# Patient Record
Sex: Female | Born: 1970 | Race: White | Hispanic: No | Marital: Married | Smoking: Never smoker
Health system: Southern US, Community
[De-identification: ages and names within clinical notes are randomized; demographics above are authoritative.]

## PROBLEM LIST (undated history)

## (undated) DIAGNOSIS — G35 Multiple sclerosis: Secondary | ICD-10-CM

## (undated) DIAGNOSIS — H469 Unspecified optic neuritis: Secondary | ICD-10-CM

## (undated) DIAGNOSIS — M21379 Foot drop, unspecified foot: Secondary | ICD-10-CM

## (undated) HISTORY — PX: WRIST SURGERY: SHX841

## (undated) HISTORY — DX: Multiple sclerosis: G35

## (undated) HISTORY — PX: SHOULDER SURGERY: SHX246

## (undated) HISTORY — DX: Foot drop, unspecified foot: M21.379

## (undated) HISTORY — DX: Unspecified optic neuritis: H46.9

## (undated) HISTORY — PX: ABDOMINAL HYSTERECTOMY: SUR658

---

## 2008-01-08 ENCOUNTER — Ambulatory Visit: Payer: Self-pay | Admitting: Family Medicine

## 2008-01-08 DIAGNOSIS — E282 Polycystic ovarian syndrome: Secondary | ICD-10-CM | POA: Insufficient documentation

## 2008-01-08 DIAGNOSIS — R635 Abnormal weight gain: Secondary | ICD-10-CM | POA: Insufficient documentation

## 2008-01-08 DIAGNOSIS — G35 Multiple sclerosis: Secondary | ICD-10-CM | POA: Insufficient documentation

## 2008-01-08 DIAGNOSIS — F3289 Other specified depressive episodes: Secondary | ICD-10-CM | POA: Insufficient documentation

## 2008-01-08 DIAGNOSIS — N760 Acute vaginitis: Secondary | ICD-10-CM | POA: Insufficient documentation

## 2008-01-08 DIAGNOSIS — F329 Major depressive disorder, single episode, unspecified: Secondary | ICD-10-CM | POA: Insufficient documentation

## 2008-01-08 LAB — CONVERTED CEMR LAB
ALT: 14 units/L (ref 0–35)
AST: 12 units/L (ref 0–37)
Albumin: 3.8 g/dL (ref 3.5–5.2)
Calcium: 8.4 mg/dL (ref 8.4–10.5)
Chloride: 105 meq/L (ref 96–112)
Hgb A1c MFr Bld: 5.3 % (ref 4.6–6.1)
LDL Cholesterol: 106 mg/dL — ABNORMAL HIGH (ref 0–99)
Potassium: 4.3 meq/L (ref 3.5–5.3)
TSH: 0.643 microintl units/mL (ref 0.350–5.50)
Total CHOL/HDL Ratio: 4

## 2008-01-09 ENCOUNTER — Encounter: Payer: Self-pay | Admitting: Family Medicine

## 2008-01-09 LAB — CONVERTED CEMR LAB: Clue Cells Wet Prep HPF POC: NONE SEEN

## 2008-01-23 ENCOUNTER — Ambulatory Visit (HOSPITAL_COMMUNITY): Payer: Self-pay | Admitting: Psychiatry

## 2008-01-23 ENCOUNTER — Encounter: Payer: Self-pay | Admitting: Family Medicine

## 2008-02-06 ENCOUNTER — Ambulatory Visit (HOSPITAL_COMMUNITY): Payer: Self-pay | Admitting: Psychiatry

## 2008-02-10 ENCOUNTER — Other Ambulatory Visit: Admission: RE | Admit: 2008-02-10 | Discharge: 2008-02-10 | Payer: Self-pay | Admitting: Family Medicine

## 2008-02-10 ENCOUNTER — Ambulatory Visit: Payer: Self-pay | Admitting: Family Medicine

## 2008-02-10 ENCOUNTER — Encounter: Payer: Self-pay | Admitting: Family Medicine

## 2008-03-10 ENCOUNTER — Telehealth (INDEPENDENT_AMBULATORY_CARE_PROVIDER_SITE_OTHER): Payer: Self-pay | Admitting: *Deleted

## 2008-03-11 ENCOUNTER — Telehealth: Payer: Self-pay | Admitting: Family Medicine

## 2008-03-11 DIAGNOSIS — R5381 Other malaise: Secondary | ICD-10-CM | POA: Insufficient documentation

## 2008-03-11 DIAGNOSIS — R5383 Other fatigue: Secondary | ICD-10-CM

## 2008-03-12 ENCOUNTER — Encounter: Payer: Self-pay | Admitting: Family Medicine

## 2008-03-13 LAB — CONVERTED CEMR LAB
Basophils Relative: 0 % (ref 0–1)
Ferritin: 40 ng/mL (ref 10–291)
Folate: >20 ng/mL
Hemoglobin: 13.2 g/dL (ref 12.0–15.0)
Lymphocytes Relative: 19 % (ref 12–46)
Lymphs Abs: 2.1 10*3/uL (ref 0.7–4.0)
MCHC: 31.5 g/dL (ref 30.0–36.0)
Monocytes Absolute: 0.8 10*3/uL (ref 0.1–1.0)
Monocytes Relative: 7 % (ref 3–12)
Neutro Abs: 8.2 10*3/uL — ABNORMAL HIGH (ref 1.7–7.7)
RBC: 4.57 M/uL (ref 3.87–5.11)
TIBC: 338 ug/dL (ref 250–470)
WBC: 11.4 10*3/uL — ABNORMAL HIGH (ref 4.0–10.5)

## 2008-03-16 ENCOUNTER — Telehealth: Payer: Self-pay | Admitting: Family Medicine

## 2008-05-25 IMAGING — CR DG ABDOMEN 1V
2 series · 2 of 2 positions shown · non-contrast
Comparison: None

CLINICAL DATA: Left flank pain, hematuria, nausea and vomiting

ABDOMEN - 1 VIEW

[view not recorded (1 of 2)]
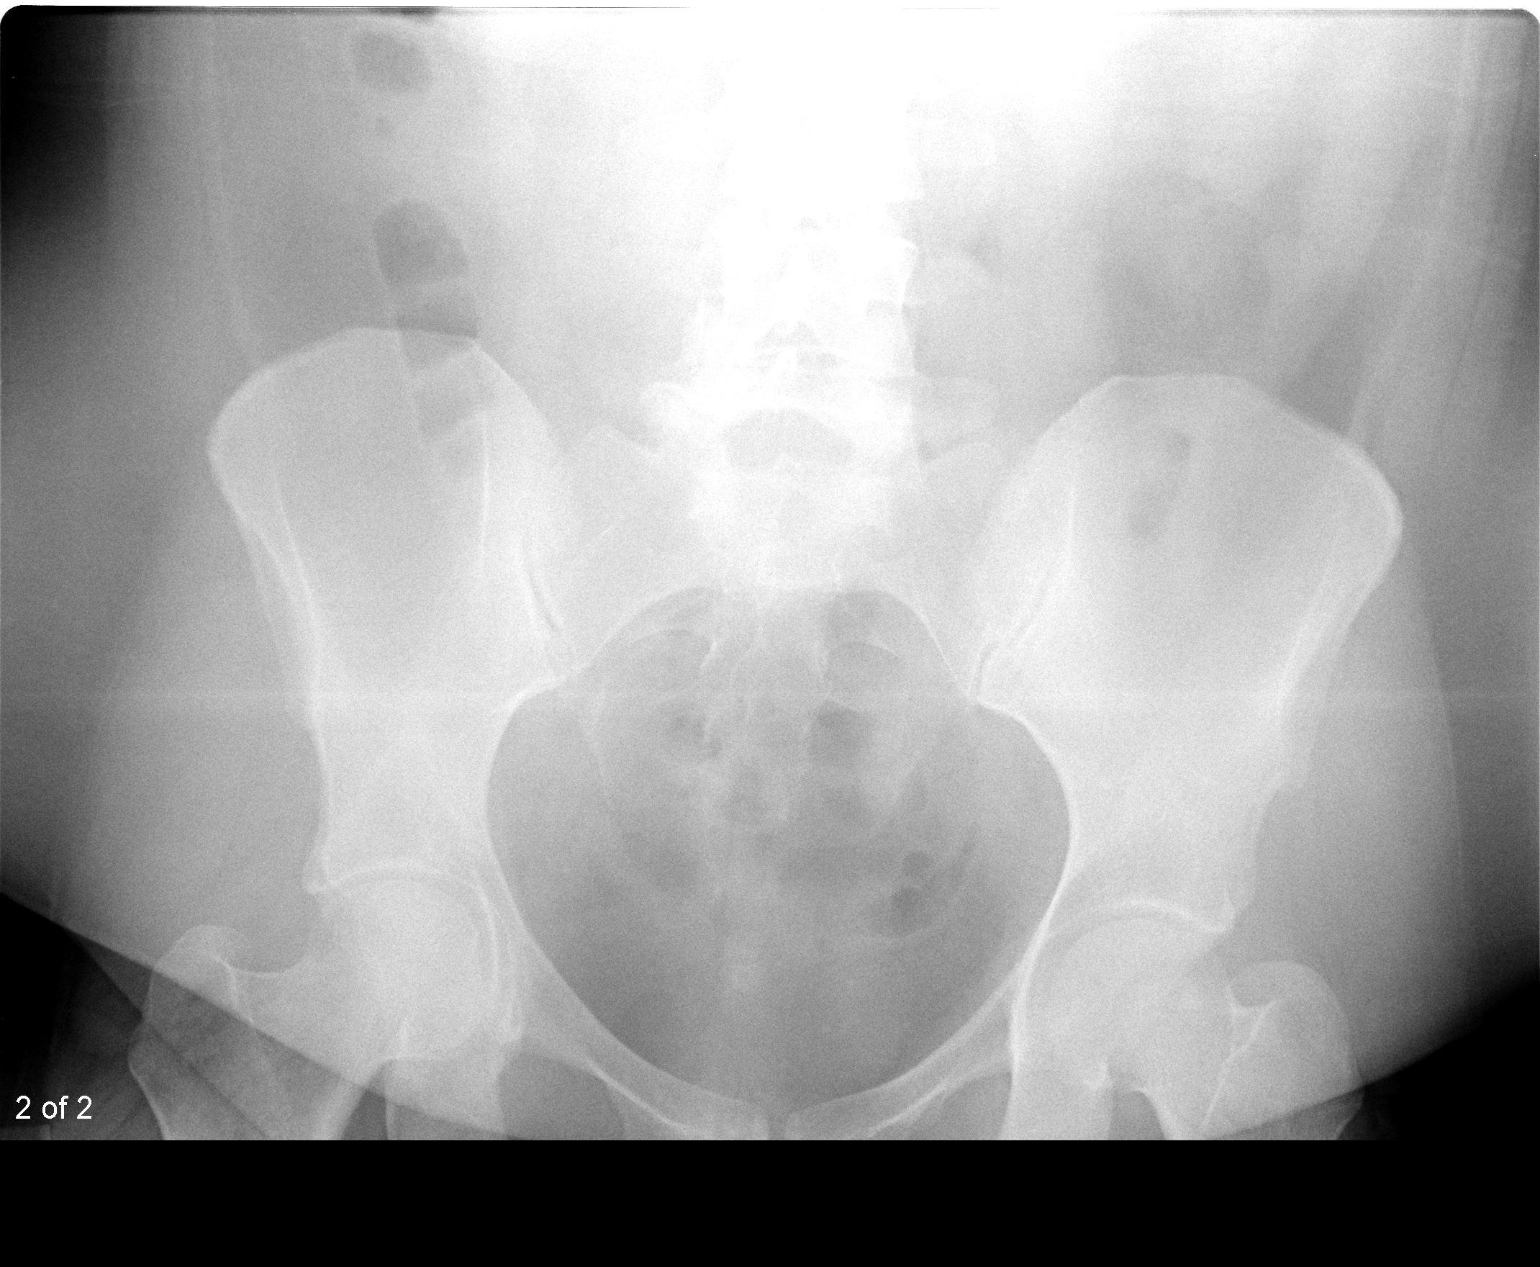

[view not recorded (2 of 2)]
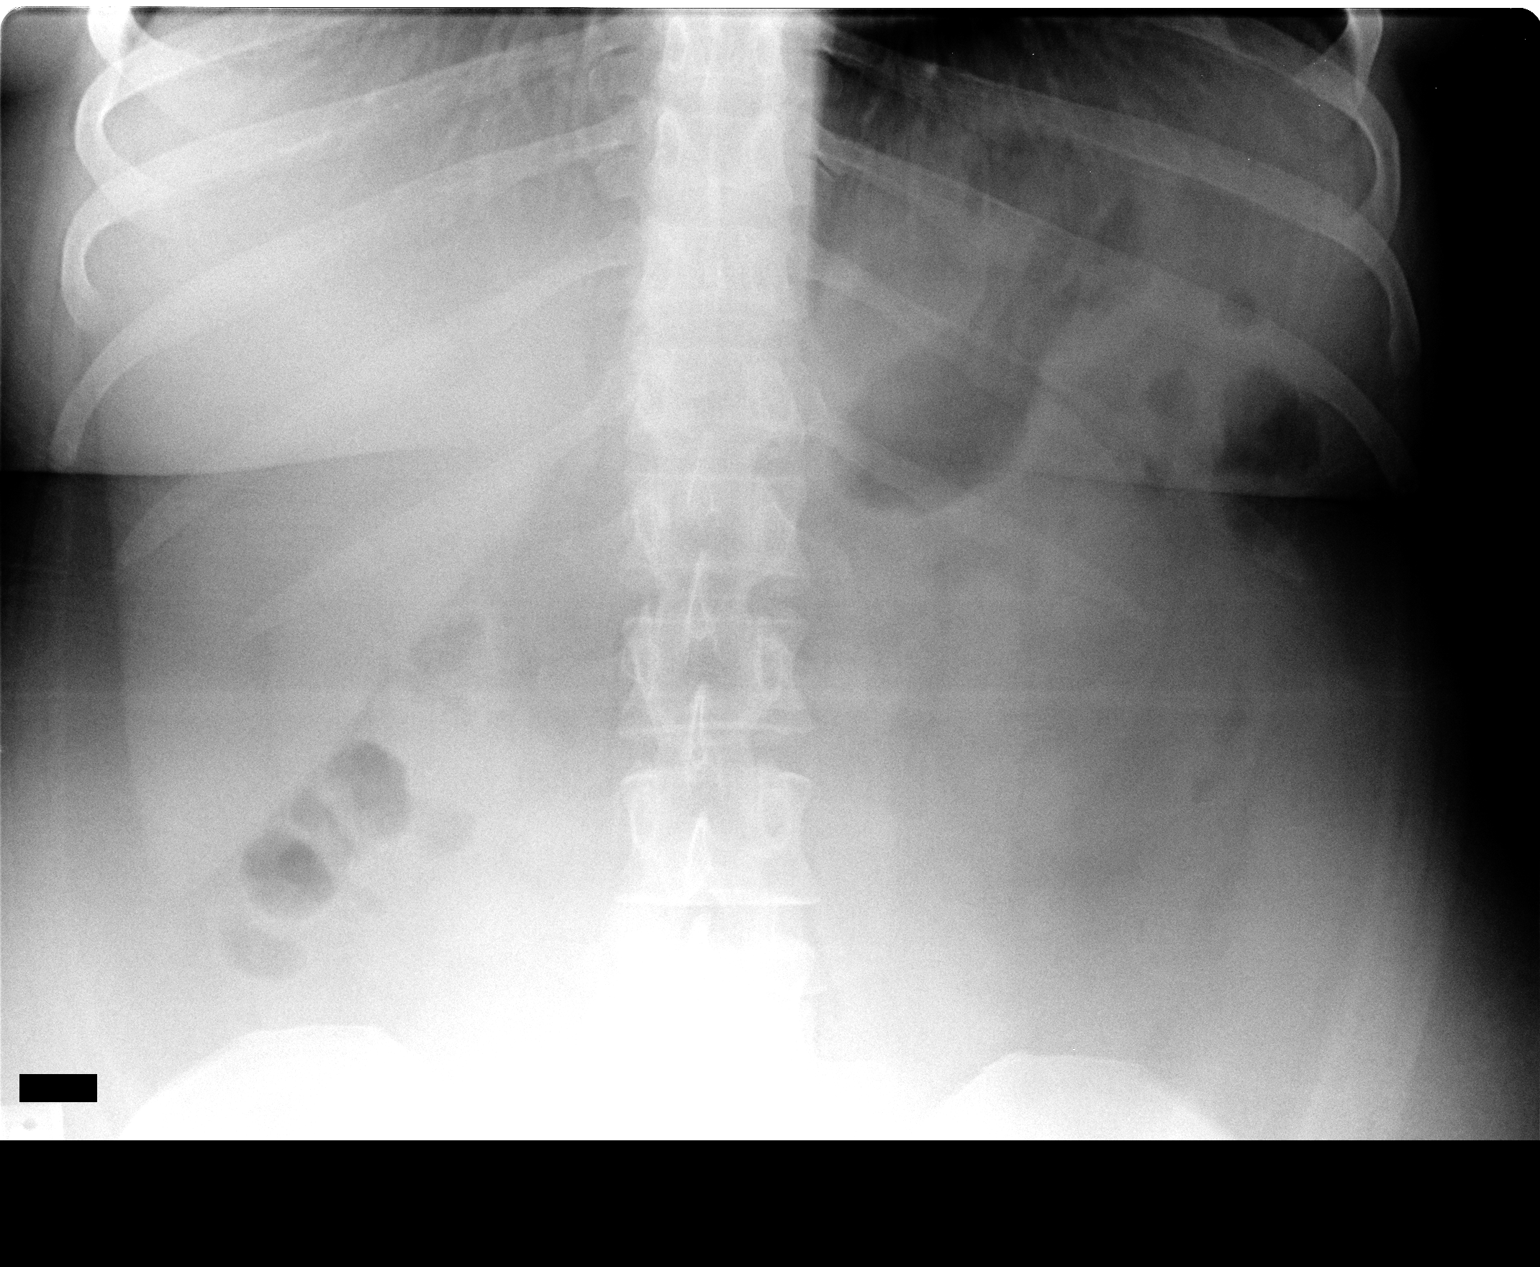

[2 of 2 positions shown; findings below may reference images not displayed]

FINDINGS: Supine film of the abdomen shows a nonspecific bowel gas
pattern.  No opaque calculi are seen overlying the kidneys or the
expected courses of the ureters.  There are degenerative changes in
the lower lumbar spine.
IMPRESSION: 1.  Nonspecific bowel gas pattern.
2.  No opaque calculi are noted.

## 2008-05-28 ENCOUNTER — Ambulatory Visit: Payer: Self-pay | Admitting: Family Medicine

## 2008-05-28 ENCOUNTER — Encounter: Admission: RE | Admit: 2008-05-28 | Discharge: 2008-05-28 | Payer: Self-pay | Admitting: Family Medicine

## 2008-05-28 DIAGNOSIS — N3000 Acute cystitis without hematuria: Secondary | ICD-10-CM | POA: Insufficient documentation

## 2008-05-28 DIAGNOSIS — N200 Calculus of kidney: Secondary | ICD-10-CM | POA: Insufficient documentation

## 2008-05-28 LAB — CONVERTED CEMR LAB
Bilirubin Urine: NEGATIVE
Glucose, Urine, Semiquant: NEGATIVE
Ketones, urine, test strip: NEGATIVE
Specific Gravity, Urine: 1.025
pH: 5.5

## 2008-05-29 ENCOUNTER — Encounter: Payer: Self-pay | Admitting: Family Medicine

## 2008-06-29 ENCOUNTER — Encounter: Payer: Self-pay | Admitting: Family Medicine

## 2008-06-29 LAB — CONVERTED CEMR LAB
ALT: 19 units/L
AST: 15 units/L
Albumin: 3.2 g/dL
Alkaline Phosphatase: 84 units/L
Calcium: 8.9 mg/dL
Chloride: 105 meq/L
Potassium: 4.5 meq/L
Sodium: 139 meq/L

## 2008-07-01 ENCOUNTER — Encounter: Payer: Self-pay | Admitting: Family Medicine

## 2009-01-18 ENCOUNTER — Encounter: Payer: Self-pay | Admitting: Family Medicine

## 2009-01-26 ENCOUNTER — Encounter: Payer: Self-pay | Admitting: Family Medicine

## 2009-03-08 ENCOUNTER — Encounter: Payer: Self-pay | Admitting: Family Medicine

## 2009-04-05 ENCOUNTER — Encounter: Admission: RE | Admit: 2009-04-05 | Discharge: 2009-04-05 | Payer: Self-pay | Admitting: Family Medicine

## 2009-04-05 ENCOUNTER — Ambulatory Visit: Payer: Self-pay | Admitting: Family Medicine

## 2009-04-05 ENCOUNTER — Encounter: Payer: Self-pay | Admitting: Family Medicine

## 2009-04-05 ENCOUNTER — Other Ambulatory Visit: Admission: RE | Admit: 2009-04-05 | Discharge: 2009-04-05 | Payer: Self-pay | Admitting: Family Medicine

## 2009-04-05 IMAGING — MG MM DIGITAL SCREENING BILAT W/ CAD
6 series · 6 of 6 positions shown · non-contrast
Comparison: none

DG SCREEN MAMMOGRAM BILATERAL
Bilateral CC and MLO view(s) were taken.
Technologist: NORHIDAYU

DIGITAL SCREENING MAMMOGRAM WITH CAD:
The breast tissue is almost entirely fatty.  No masses or malignant type calcifications are 
identified.

[R CC (1 of 2)]
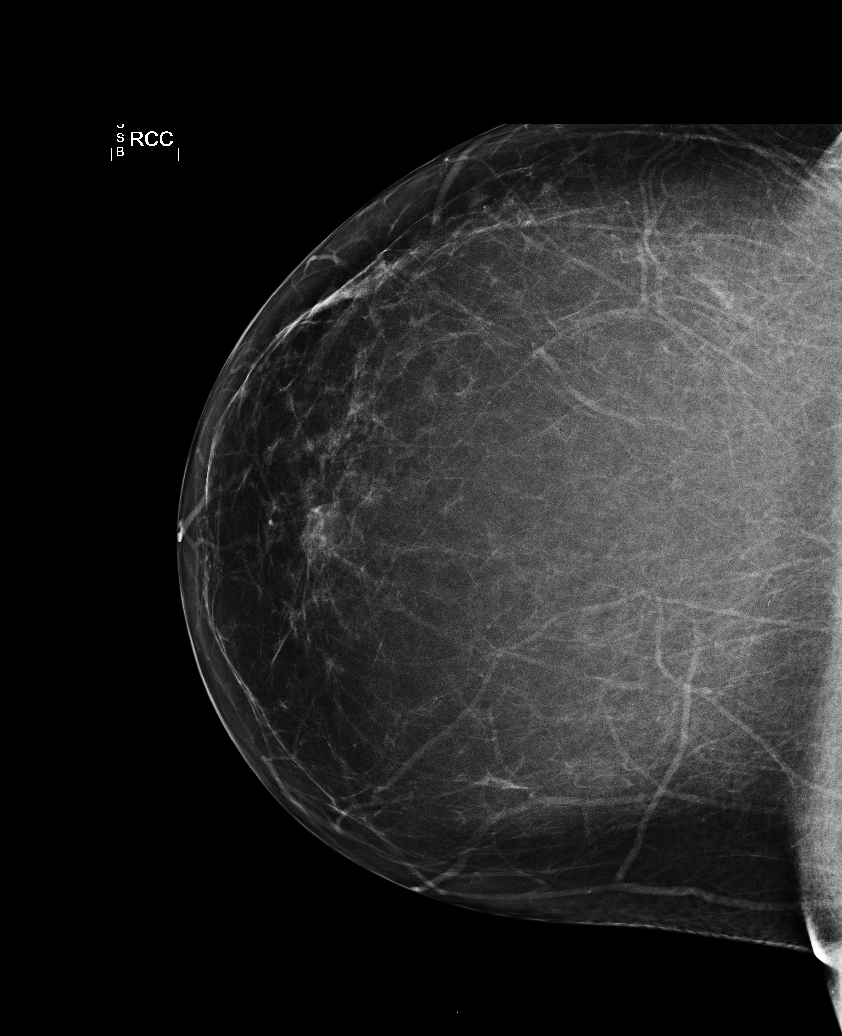

[L CC]
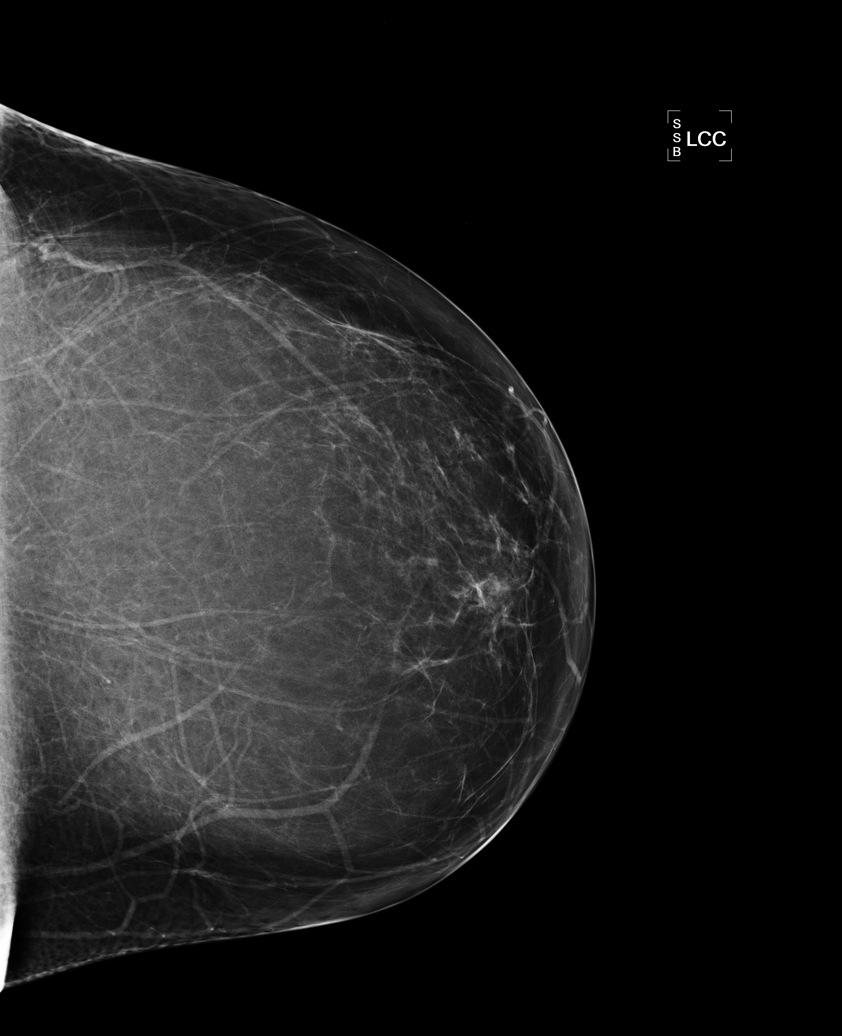

[L MLO (1 of 2)]
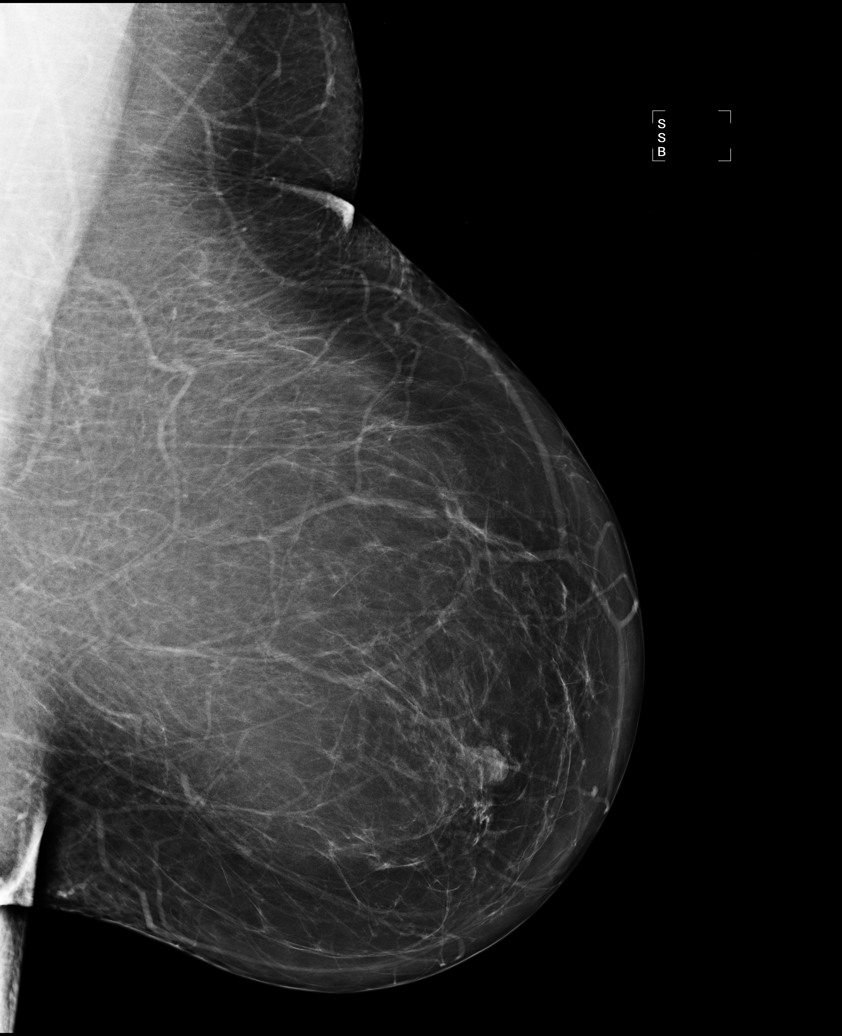

[R MLO]
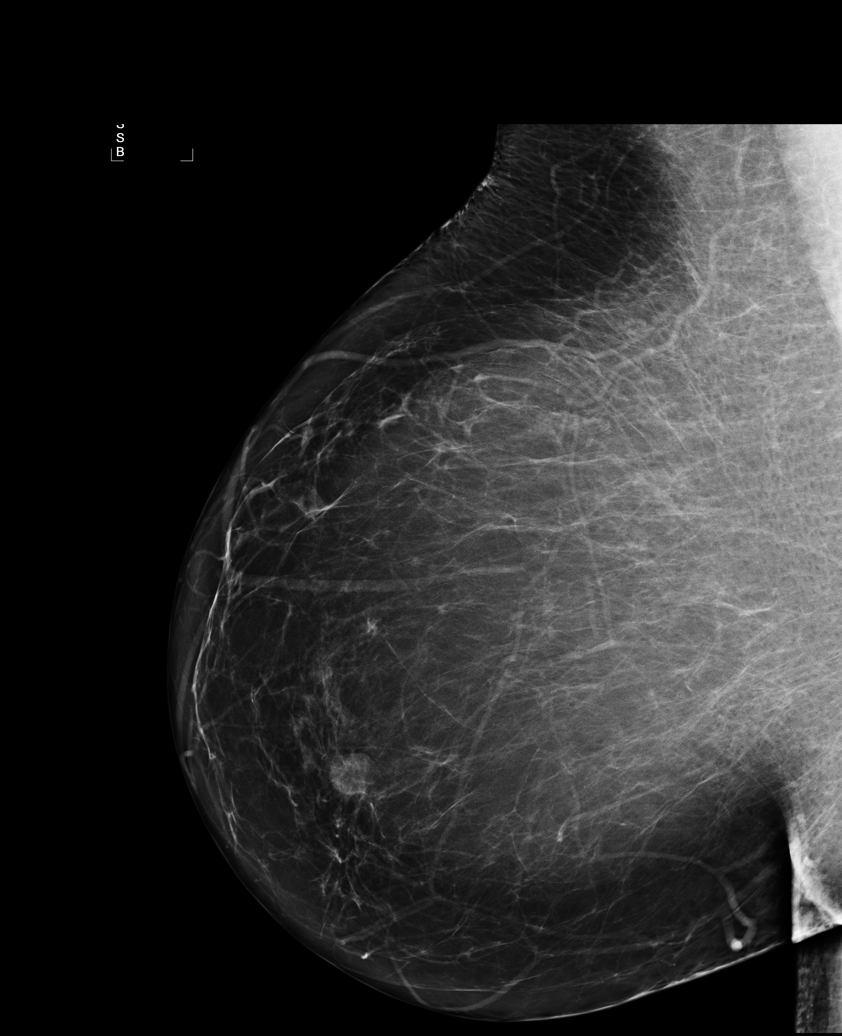

[R CC (2 of 2)]
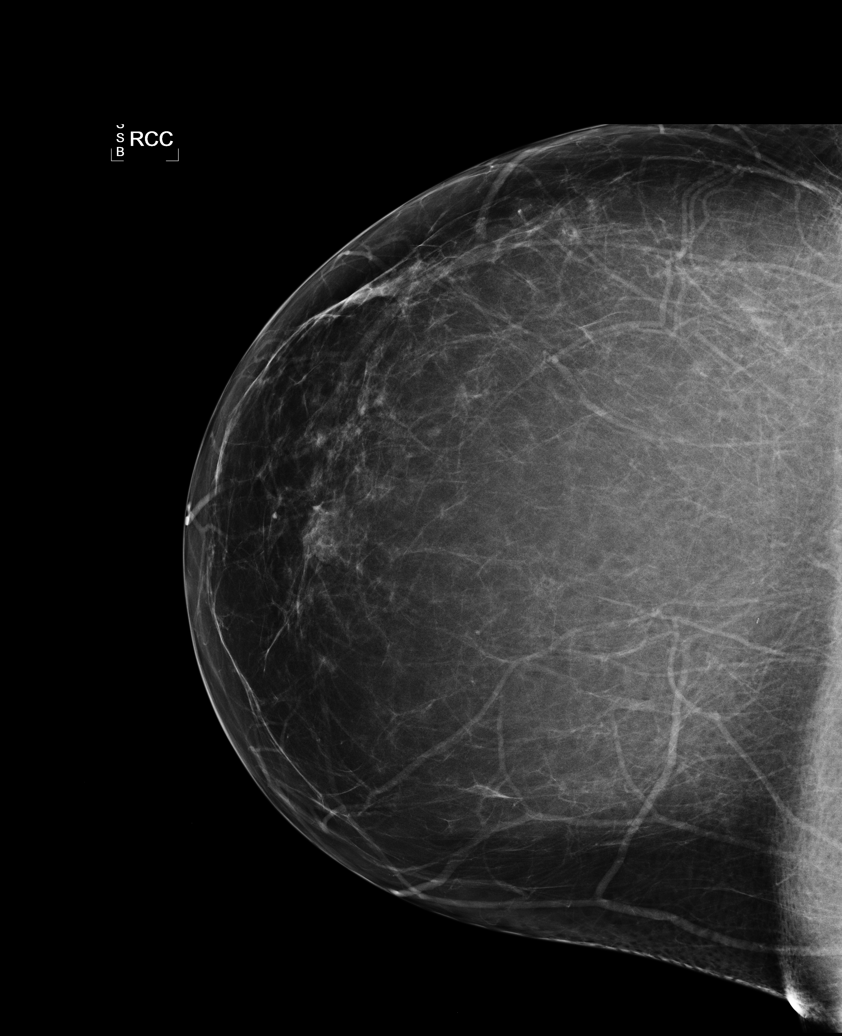

[L MLO (2 of 2)]
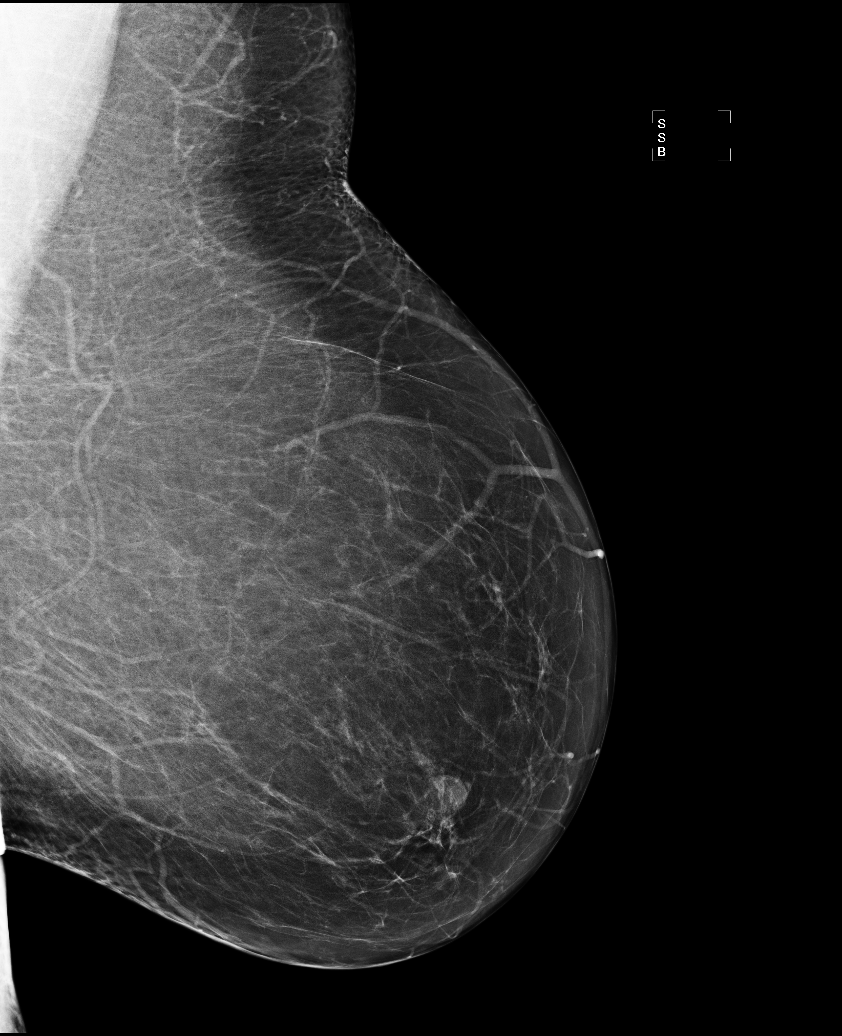

[6 of 6 positions shown; findings below may reference images not displayed]

IMPRESSION: No specific mammographic evidence of malignancy.  Next screening mammogram is recommended in one 
year.

A result letter of this screening mammogram will be mailed directly to the patient.

ASSESSMENT: Negative - BI-RADS 1

Screening mammogram in 1 year.
THIS WAS ANALAYZED BY COMPUTER AIDED DETECTION. , THIS PROCEDURE WAS A DIGITAL MAMMOGRAM.

## 2009-04-06 ENCOUNTER — Encounter: Payer: Self-pay | Admitting: Family Medicine

## 2009-04-06 LAB — CONVERTED CEMR LAB
Glucose, Bld: 81 mg/dL (ref 70–99)
HDL: 44 mg/dL (ref >39)
LDL Cholesterol: 73 mg/dL (ref 0–99)
TSH: 0.983 microintl units/mL (ref 0.350–4.500)

## 2009-04-16 ENCOUNTER — Telehealth: Payer: Self-pay | Admitting: Family Medicine

## 2009-07-08 ENCOUNTER — Encounter: Payer: Self-pay | Admitting: Family Medicine

## 2009-07-08 ENCOUNTER — Telehealth: Payer: Self-pay | Admitting: Family Medicine

## 2009-09-16 ENCOUNTER — Encounter: Payer: Self-pay | Admitting: Family Medicine

## 2010-01-20 ENCOUNTER — Telehealth: Payer: Self-pay | Admitting: Family Medicine

## 2010-01-24 ENCOUNTER — Telehealth (INDEPENDENT_AMBULATORY_CARE_PROVIDER_SITE_OTHER): Payer: Self-pay | Admitting: *Deleted

## 2010-01-26 ENCOUNTER — Telehealth: Payer: Self-pay | Admitting: Family Medicine

## 2010-02-17 ENCOUNTER — Ambulatory Visit: Payer: Self-pay | Admitting: Family Medicine

## 2010-02-17 DIAGNOSIS — J019 Acute sinusitis, unspecified: Secondary | ICD-10-CM | POA: Insufficient documentation

## 2010-03-16 ENCOUNTER — Encounter: Payer: Self-pay | Admitting: Family Medicine

## 2011-01-24 NOTE — Progress Notes (Signed)
Summary: Pink eye  Phone Note Call from Patient   Caller: Patient [phone] Summary of Call: Pt called requesting ABX eye drops. Pt's son has pink eye and now Pt's eye is red and itching. I called Pt and suggested OV bc ABX are not Rxd w/out OV. Pt stated that was not possible and hung up on me.  Initial call taken by: Payton Spark CMA,  January 20, 2010 11:15 AM  Follow-up for Phone Call        as office policy, no abx are prescribed w/o an office visit. Follow-up by: Seymour Bars DO,  January 20, 2010 11:37 AM

## 2011-01-24 NOTE — Progress Notes (Signed)
Summary: change SSRI  Phone Note Call from Patient   Caller: Patient Summary of Call: Pt LMOM requesting Zoloft be changed to Celexa due to cost. Please advise.  Initial call taken by: Payton Spark CMA,  January 24, 2010 3:01 PM  Follow-up for Phone Call        zoloft not on her med list - lexapro is.  does she want to change this? Follow-up by: Seymour Bars DO,  January 25, 2010 9:21 PM  Additional Follow-up for Phone Call Additional follow up Details #1::        Number has been disconnected- will wait for pt to call back. Additional Follow-up by: Kathlene November,  January 26, 2010 8:12 AM    Additional Follow-up for Phone Call Additional follow up Details #2::    pt called back and med sent to pharmacy Follow-up by: Kathlene November,  January 26, 2010 1:05 PM

## 2011-01-24 NOTE — Progress Notes (Signed)
Summary: rx change lexapro  Phone Note Call from Patient Call back at [phone]   Caller: Patient Summary of Call: pt called left msg  has rx lexapro, would like changed to Celexa, generic and it is cheaper, use [facility] in [place] ph # [phone] Initial call taken by: Kandice Hams,  January 26, 2010 12:48 PM    New/Updated Medications: CITALOPRAM HYDROBROMIDE 20 MG TABS (CITALOPRAM HYDROBROMIDE) 1 tab by mouth daily Prescriptions: CITALOPRAM HYDROBROMIDE 20 MG TABS (CITALOPRAM HYDROBROMIDE) 1 tab by mouth daily  #30 x 5   Entered and Authorized by:   Seymour Bars DO   Signed by:   Seymour Bars DO on 01/26/2010   Method used:   Electronically to        [facility] # [phone]* (retail)       [address] [place], [place]  [address]       Ph: [phone]       Fax: [phone]   RxID:   [id number]

## 2011-01-24 NOTE — Assessment & Plan Note (Signed)
Summary: SINUSITIS   Vital Signs:  Patient profile:   40 year old female Menstrual status:  irregular Height:      65 inches Weight:      287 pounds BMI:     47.93 O2 Sat:      95 % on Room air Temp:     98.5 degrees F oral Pulse rate:   77 / minute BP sitting:   166 / 96  (left arm) Cuff size:   large  Vitals Entered By: Connie Vaughan CMA (February 17, 2010 9:23 AM)  O2 Flow:  Room air CC: Sinusitis and congestion x 2 weeks. Seen by ENT and on ABX but not feeling any better   Primary Care Provider:  Seymour Bars DO  CC:  Sinusitis and congestion x 2 weeks. Seen by ENT and on ABX but not feeling any better.  History of Present Illness: Connie Vaughan is a 39 year old woman with a history of MS who has had sinus congestion x2 weeks. She started with a HA, sinus congestion, and runny nose. After these symptoms persisted for one week she was seen by ENT and was given a prednisone injection. A few days later she began running a fever to 101 and called back to ENT and was given a prescription for Omnicef x10 days which she started on Friday. She is now running a low grade fever every night 99-101 and feels that her HA and sinus congestion are still the same. When she has a fever this seems to exacerbate her MS symptoms of dizziness and vision changes. She has also started to have cough and mild sore throat. She has gotten warm overnight but no true night sweats or chills. She has taken Tylenol, Flonase, and Mucinex which have helped. No nausea, vomiting, constipation, diarrhea, or urinary changes. She does not have a history of asthma but has had problems with allergies in the past. She is still taking her Copaxone.   Current Medications (verified): 1)  Keppra 500 Mg  Tabs (Levetiracetam) .... Take 1 Tablet By Mouth Once A Day 2)  Zovia 1/35e (28) 1-35 Mg-Mcg  Tabs (Ethynodiol Diac-Eth Estradiol) .... Take 1 Tablet By Mouth Once A Day 3)  Ritalin 10 Mg  Tabs (Methylphenidate Hcl) .... Take 1  Tablet By Mouth Once A Day As Needed 4)  Citalopram Hydrobromide 20 Mg Tabs (Citalopram Hydrobromide) .Marland Kitchen.. 1 Tab By Mouth Daily 5)  Copaxone 20 Mg/ml Kit (Glatiramer Acetate) .... Inject Subcutaneously Daily 6)  Neurontin 300 Mg Caps (Gabapentin) .... Take One-Two By Mouth As Needed  Allergies (verified): 1)  ! Sulfa  Past History:  Past Medical History: Reviewed history from 04/05/2009 and no changes required. MS 2002, Dr Tinnie Gens at [facility] PCOS 2002 kidney stones depression anxiety morbid obesity [id number]   Social History: Reviewed history from 04/05/2009 and no changes required. Librarian, academic for [employer]. Married to [place]. Has a 67 yo son. Never smoked. Occas ETOH. Fair diet.   Review of Systems      See HPI  Physical Exam  General:  Obese female in no acute distress. Head:  Normocephalic and atraumatic.  Eyes:  PERRL. Sclera clear.  Ears:  TMs intact with normal light reflex bilaterally.  Nose:  Slightly swollen nasal turbinates with no rhinorrhea seen.  Mouth:  Oropharynx clear with no lesions or exudate.  Neck:  Supple with no lymphadenopathy.  Lungs:  Clear to auscultation bilaterally with no wheezes rales or rhonchi. Normal work of breathing.  Heart:  RRR, normal S1 and S2. No murmur, rub, or gallop.  Pulses:  2+ radial pulses bilaterally. Extremities:  No cyanosis, clubbing, or edema.  Cervical Nodes:  No lymphadenopathy noted Psych:  Alert and oriented with normal concentration and attention.    Impression & Recommendations:  Problem # 1:  ACUTE SINUSITIS (ICD-461.9) Given lack of clinical improvement on Omnicef and immunosuppressant therapy, will start 5-day course of Levaquin. Hold Copaxone for 7 days to prevent suppression of immune response. Return for follow-up in 2 weeks of blood pressure as it was elevated to 166/96 in the office today. Continue plain Mucinex for symptomatic relief.   Her updated medication list for this  problem includes:    Levaquin 750 Mg Tabs (Levofloxacin) .Marland Kitchen... 1 tab by mouth once daily x 5 days  Complete Medication List: 1)  Keppra 500 Mg Tabs (Levetiracetam) .... Take 1 tablet by mouth once a day 2)  Zovia 1/35e (28) 1-35 Mg-mcg Tabs (Ethynodiol diac-eth estradiol) .... Take 1 tablet by mouth once a day 3)  Ritalin 10 Mg Tabs (Methylphenidate hcl) .... Take 1 tablet by mouth once a day as needed 4)  Citalopram Hydrobromide 20 Mg Tabs (Citalopram hydrobromide) .Marland Kitchen.. 1 tab by mouth daily 5)  Copaxone 20 Mg/ml Kit (Glatiramer acetate) .... Inject subcutaneously daily 6)  Neurontin 300 Mg Caps (Gabapentin) .... Take one-two by mouth as needed 7)  Levaquin 750 Mg Tabs (Levofloxacin) .Marland Kitchen.. 1 tab by mouth once daily x 5 days  Patient Instructions: 1)  Change Omnicef to Levaquin -- take for 5 days. 2)  Hold the Copaxone for 7 days.   3)  Call if not improving in 7 days. 4)  REturn for f/u 2 wks. Prescriptions: LEVAQUIN 750 MG TABS (LEVOFLOXACIN) 1 tab by mouth once daily x 5 days  #5 tabs x 0   Entered and Authorized by:   Seymour Bars DO   Signed by:   Seymour Bars DO on 02/17/2010   Method used:   Electronically to        [facility] # [phone]* (retail)       [address] [place], [place]  [address]       Ph: [phone]       Fax: [phone]   RxID:   [id number]

## 2011-01-24 NOTE — Letter (Signed)
Summary: Letter from Patient Regarding Copaxone & Request for Records  Letter from Patient Regarding Copaxone & Request for Records   Imported By: Lanelle Bal 03/16/2010 11:29:04  _____________________________________________________________________  External Attachment:    Type:   Image     Comment:   External Document

## 2016-04-19 ENCOUNTER — Telehealth: Payer: Self-pay | Admitting: Neurology

## 2016-04-19 NOTE — Telephone Encounter (Signed)
LMOM for Connie Vaughan that at this time, [facility] does not have a sat office, and there are no plans for one.  We are happy to make an appt. for her here at [facility] if she would like.  She does not need to return this call unless she has questions or needs an appt/fim

## 2016-04-19 NOTE — Telephone Encounter (Signed)
Pt called said she was a pt of Dr Clinical research associate. She was inquiring about satellite office

## 2019-10-14 ENCOUNTER — Telehealth: Payer: Self-pay | Admitting: Neurology

## 2019-10-14 ENCOUNTER — Ambulatory Visit (INDEPENDENT_AMBULATORY_CARE_PROVIDER_SITE_OTHER): Payer: 59 | Admitting: Neurology

## 2019-10-14 ENCOUNTER — Encounter: Payer: Self-pay | Admitting: Neurology

## 2019-10-14 ENCOUNTER — Other Ambulatory Visit: Payer: Self-pay

## 2019-10-14 VITALS — BP 138/90 | HR 98 | Temp 97.4°F | Ht 64.0 in | Wt 301.6 lb

## 2019-10-14 DIAGNOSIS — G35 Multiple sclerosis: Secondary | ICD-10-CM | POA: Diagnosis not present

## 2019-10-14 DIAGNOSIS — R29898 Other symptoms and signs involving the musculoskeletal system: Secondary | ICD-10-CM

## 2019-10-14 DIAGNOSIS — Z8669 Personal history of other diseases of the nervous system and sense organs: Secondary | ICD-10-CM

## 2019-10-14 DIAGNOSIS — M545 Low back pain, unspecified: Secondary | ICD-10-CM

## 2019-10-14 DIAGNOSIS — R200 Anesthesia of skin: Secondary | ICD-10-CM | POA: Diagnosis not present

## 2019-10-14 DIAGNOSIS — Z79899 Other long term (current) drug therapy: Secondary | ICD-10-CM | POA: Diagnosis not present

## 2019-10-14 DIAGNOSIS — E559 Vitamin D deficiency, unspecified: Secondary | ICD-10-CM

## 2019-10-14 DIAGNOSIS — R5383 Other fatigue: Secondary | ICD-10-CM

## 2019-10-14 MED ORDER — EMGALITY 120 MG/ML ~~LOC~~ SOAJ: 1.0000 "pen " | SUBCUTANEOUS | 4 refills | Status: DC

## 2019-10-14 MED ORDER — TRAMADOL HCL 50 MG PO TABS
ORAL_TABLET | ORAL | 2 refills | Status: DC
Start: 2019-10-14 — End: 2020-01-05

## 2019-10-14 MED ORDER — LAMOTRIGINE 100 MG PO TABS: 100.0000 mg | ORAL_TABLET | Freq: Two times a day (BID) | ORAL | 5 refills | Status: DC

## 2019-10-14 NOTE — Patient Instructions (Signed)
The pharmacy has the prescription for lamotrigine 100 mg tablets. For 5 days, just take one half pill a day. For the next 5 days, take one half pill twice a day. For the next 5 days, take one half pill 3 times a day Then start taking one pill twice a day from this point on.    In the future, we may increase the dose further.  If you get a rash, need to stop the medication and not take it again. 

## 2019-10-14 NOTE — Telephone Encounter (Signed)
FMLA paperwork faxed 10/14/19 to [phone].

## 2019-10-14 NOTE — Progress Notes (Addendum)
[facility]  PATIENT: Connie Vaughan DOB: 12/30/1970  REFERRING DOCTOR OR PCP:  Tobie Lords, PA SOURCE: Patient, notes from Dr. Daphane Shepherd, imaging and lab reports, MRI of the brain from 2019 personally reviewed.  _________________________________   HISTORICAL  CHIEF COMPLAINT:  Chief Complaint  Patient presents with  . New Patient (Initial Visit)    RM 8 with husband (temp: 97.8) Transfer of care for MS from Marlena Clipper, MD at [facility].   . Multiple Sclerosis    On [place].     HISTORY OF PRESENT ILLNESS:  I had the pleasure of seeing patient, Connie Vaughan, at the MS center at [facility] for neurologic consultation regarding her multiple sclerosis.  She is a 48 year old woman who presented with left optic neuritis in 1999 or 2000.   After a few months, symptoms resolved.  At the time, MRI was reportedly normal.  Two years later, she had diplopia and MRI showed a few lesions not present in 2000.   She received steroids and started Betaseron   She stopped a while while pregnant in 2006.   She had an exacerbation with left foot drop and fatigue in 2007 and also had trouble tolerating Betaseron.  She was switched to Copaxone but had site reactions and subcutaneous nodules.  Initially, she was seeing Dr. Tinnie Gens at [facility].  I saw her for a couple years in 2014 through 2015.  I switched her to Tecfidera.    Most recently she was seeing Dr. Daphane Shepherd.   She did well initially but had GI issues.  She switched to [facility] late last year and is due for her second year in December.    She denies any new MS symptoms over the past year.  No recent exacerbations.  Currently, she has a mild left foot drop.   She can walk about 50 yards without a break (longer with breaks).  Pain is limiting her more than strength or clumsiness.   She has burning pain in her left leg associated with mild numbness.  Walking increases the pain.     Ibuprofen does not  help.    She is on gabapentin 800 mg po qid.     Vision has good acuity but reduced color saturation OS.   No diplopia.     She has no bladder issues.   She is noting more fatigue.   She is sleeping 8+ hours most nights.   She only snores a little bit and has no OSA signs.    She had a PSG in 2014 or 2015 and did not have OSA.   TSH was fine last month.   Vit D was low in 2015.   She is sometimes irritable, usually when having more pain.    She feels she has mild depression.    She has no major cognitive issues.  She is sometimes distactable.  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     I personally reviewed the MRI of the brain 08/07/2018.   It shows a few periventricular lesions and one in the anterior right medulla.  MRI cervical spine 11/02/2016, by report, showed a focus at C6 compatible with MS.     She reports a lumbar spine MRI showing degnerative changes and mild spinal stenosis at one level.    She also has had hip bursitis on the left.    She is having more migraine headaches with nausea, photophobia, phonophobia.   She has 18-20  migraine days a month, 10 with severe migraines and 8-10 with mild to moderate.  Topamax and gabapentin have not helped.  Keppra helped some initially, less so more recently.  Cymbalta helps her mood but not the migraines.  Relpax helps some but not all of the times..  Caffeine sometimes helps.   She has not tried any of the anti-CGRP agents or Botox.   REVIEW OF SYSTEMS: Constitutional: No fevers, chills, sweats, or change in appetite.  She has fatigue.  She gets frequent headaches Eyes: No visual changes, double vision, eye pain Ear, nose and throat: No hearing loss, ear pain, nasal congestion, sore throat Cardiovascular: No chest pain, palpitations Respiratory: No shortness of breath at rest or with exertion.   No wheezes GastrointestinaI: No nausea, vomiting, diarrhea, abdominal pain, fecal incontinence Genitourinary: No dysuria, urinary retention or  frequency.  No nocturia. Musculoskeletal:Lower back pain as above Integumentary: No rash, pruritus, skin lesions Neurological: as above Psychiatric: No depression at this time.  No anxiety Endocrine: No palpitations, diaphoresis, change in appetite, change in weigh or increased thirst Hematologic/Lymphatic: No anemia, purpura, petechiae. Allergic/Immunologic: No itchy/runny eyes, nasal congestion, recent allergic reactions, rashes  ALLERGIES: Allergies  Allergen Reactions  . Sulfonamide Derivatives Hives and Rash    HOME MEDICATIONS:  Current Outpatient Medications:  .  aspirin-acetaminophen-caffeine (EXCEDRIN MIGRAINE) 250-250-65 MG tablet, Take by mouth every 6 (six) hours as needed for headache., Disp: , Rfl:  .  BIOTIN PO, Take 1 Dose by mouth daily., Disp: , Rfl:  .  DULoxetine (CYMBALTA) 30 MG capsule, Take 30 mg by mouth at bedtime., Disp: , Rfl:  .  eletriptan (RELPAX) 40 MG tablet, Take 40 mg by mouth as needed for migraine or headache. May repeat in 2 hours if headache persists or recurs., Disp: , Rfl:  .  gabapentin (NEURONTIN) 800 MG tablet, Take 800 mg by mouth 4 (four) times daily., Disp: , Rfl:  .  ibuprofen (ADVIL) 800 MG tablet, Take 800 mg by mouth 3 (three) times daily. 1-3 tablets TID po, Disp: , Rfl:  .  levETIRAcetam (KEPPRA) 750 MG tablet, Take 1,500 mg by mouth 2 (two) times daily., Disp: , Rfl:  .  Melatonin 3 MG TABS, Take by mouth., Disp: , Rfl:  .  methylphenidate (RITALIN) 10 MG tablet, Take 40 mg by mouth daily., Disp: , Rfl:  .  tiZANidine (ZANAFLEX) 4 MG tablet, Take 4 mg by mouth every 8 (eight) hours as needed for muscle spasms., Disp: , Rfl:  .  vortioxetine HBr (TRINTELLIX) 10 MG TABS tablet, Take 10 mg by mouth daily., Disp: , Rfl:  .  Galcanezumab-gnlm (EMGALITY) 120 MG/ML SOAJ, Inject 1 pen into the skin every 28 (twenty-eight) days., Disp: 3 pen, Rfl: 4 .  lamoTRIgine (LAMICTAL) 100 MG tablet, Take 1 tablet (100 mg total) by mouth 2 (two) times  daily., Disp: 60 tablet, Rfl: 5 .  traMADol (ULTRAM) 50 MG tablet, One or two po qd prn headache or leg pain, Disp: 30 tablet, Rfl: 2  PAST MEDICAL HISTORY: Past Medical History:  Diagnosis Date  . Foot drop left  . Multiple sclerosis (HCC)   . Optic neuritis     PAST SURGICAL HISTORY: Past Surgical History:  Procedure Laterality Date  . ABDOMINAL HYSTERECTOMY    . CESAREAN SECTION    . SHOULDER SURGERY Right   . WRIST SURGERY Left     FAMILY HISTORY: History reviewed. No pertinent family history.  SOCIAL HISTORY:  Social History   Socioeconomic  History  . Marital status: Married    Spouse name: Not on file  . Number of children: 1  . Years of education: 41  . Highest education level: Not on file  Occupational History  . Not on file  Social Needs  . Financial resource strain: Not on file  . Food insecurity    Worry: Not on file    Inability: Not on file  . Transportation needs    Medical: Not on file    Non-medical: Not on file  Tobacco Use  . Smoking status: Never Smoker  . Smokeless tobacco: Never Used  Substance and Sexual Activity  . Alcohol use: Yes    Comment: Once a week or less  . Drug use: Never  . Sexual activity: Not on file  Lifestyle  . Physical activity    Days per week: Not on file    Minutes per session: Not on file  . Stress: Not on file  Relationships  . Social Herbalist on phone: Not on file    Gets together: Not on file    Attends religious service: Not on file    Active member of club or organization: Not on file    Attends meetings of clubs or organizations: Not on file    Relationship status: Not on file  . Intimate partner violence    Fear of current or ex partner: Not on file    Emotionally abused: Not on file    Physically abused: Not on file    Forced sexual activity: Not on file  Other Topics Concern  . Not on file  Social History Narrative   Right handed    Caffeine use: daily (2 [place] per day)   Lives  with husband     PHYSICAL EXAM  Vitals:   10/14/19 0846  BP: 138/90  Pulse: 98  Temp: (!) 97.4 F (36.3 C)  SpO2: 96%  Weight: (!) 301 lb 9.6 oz (136.8 kg)  Height: 5\' 4"  (1.626 m)    Body mass index is 51.77 kg/m.   General: The patient is well-developed and well-nourished and in no acute distress  HEENT:  Head is Palmer/AT.  Sclera are anicteric.  Funduscopic exam shows mild optic nerve pallor on the left.  Normal retinal vessels.  Neck: No carotid bruits are noted.  The neck is nontender.  Cardiovascular: The heart has a regular rate and rhythm with a normal S1 and S2. There were no murmurs, gallops or rubs.    Skin: Extremities are without rash or  edema.  Musculoskeletal:  Back is nontender  Neurologic Exam  Mental status: The patient is alert and oriented x 3 at the time of the examination. The patient has apparent normal recent and remote memory, with an apparently normal attention span and concentration ability.   Speech is normal.  Cranial nerves: Extraocular movements are full.  She has a mild APD on the left.  Color vision is reduced on the left..  Facial symmetry is present. There is good facial sensation to soft touch bilaterally.Facial strength is normal.  Trapezius and sternocleidomastoid strength is normal. No dysarthria is noted.  The tongue is midline, and the patient has symmetric elevation of the soft palate. No obvious hearing deficits are noted.  Motor:  Muscle bulk is normal.   Tone is normal. Strength is  5 / 5 in all 4 extremities except 4/5 in the left EHL of the foot.  Rapid altering movements were  performed slower on the left than the right..   Sensory: Sensory testing is intact to pinprick, soft touch and vibration sensation in all 4 extremities.  Coordination: Cerebellar testing reveals good finger-nose-finger and mildly reduced left heel-to-shin .  Gait and station: Station is normal.   The gait is mildly wide with a slight left foot drop and  the tandem gait is wide.. Romberg is negative.   Reflexes: Deep tendon reflexes are symmetric and normal bilaterally.         ASSESSMENT AND PLAN  Multiple sclerosis (HCC) - Plan: MR THORACIC SPINE WO CONTRAST, MR BRAIN W WO CONTRAST, Hepatitis B core antibody, total, Hepatitis B surface antigen, QuantiFERON-TB Gold Plus, Hepatitis B surface antibody,qualitative, CBC with Differential/Platelet, HIV Antibody (routine testing w rflx)  High risk medication use - Plan: Hepatitis B core antibody, total, Hepatitis B surface antigen, QuantiFERON-TB Gold Plus, Hepatitis B surface antibody,qualitative, CBC with Differential/Platelet, HIV Antibody (routine testing w rflx)  Weakness of left lower extremity - Plan: MR LUMBAR SPINE WO CONTRAST, MR THORACIC SPINE WO CONTRAST  Numbness in left leg - Plan: MR LUMBAR SPINE WO CONTRAST, MR THORACIC SPINE WO CONTRAST  Lumbar pain - Plan: MR LUMBAR SPINE WO CONTRAST  Other fatigue  Vitamin D deficiency - Plan: VITAMIN D 25 Hydroxy (Vit-D Deficiency, Fractures)  History of optic neuritis  In summary, Connie Vaughan is a 48 year old woman who had optic neuritis around 2000 and was diagnosed with MS about 2002.  She is currently on M[place] and is due for her next treatment at the end of the year.  Due to the holidays she will plan on pushing this off until January.  I will check blood work to rule out chronic infections to allow her to get the second dose.  We will also recheck a lymphocyte count to make sure that it is 0.8 or higher.  She has tolerated the Mavenclad well and hopefully will be able to go many years without another treatment.  Currently, her biggest concern is her left leg pain.  She also has noted a little more weakness and numbness in the left leg.  Most likely, this is due to her MS.  She has never had a thoracic spine MRI and we need to determine if the plaque there is causing her symptoms as the leg is much worse than the arm.  Additionally,  because she is having pain out of proportion to numbness symptoms could be due to a radiculopathy and we will check an MRI of the lumbar spine to determine if this symptom could be treated by epidural or other procedure.  She is currently scheduled to have an MRI of the brain in December.  We will see if we can get oh the studies done at the same time to be more convenient for her.  I will have her start lamotrigine as it is often helpful for dysesthetic pain associated with MS.  If not better, consider oxcarbazepine.  Also, to help with the pain, I sent in a prescription for tramadol to take as needed.  Another problem is migraines.  She has migraine headaches more than 15 days a month for more than 4 hours a day and might benefit from prophylactic treatment.  We discussed options.  Oral agents have not helped her much in the past.  I gave her samples of Emgality.  And she will refill if she gets a benefit.  Emgality [phone](Lot#[id number] [place]   02/2021).    I also filled out  FMLA paperwork for up to 2 days a month for MS related symptoms or migraine.  She will return to see me in 5 months (this will coincide with 7651-month lab work after her second year of Mavenclad) or sooner if he has new or worsening neurologic symptoms.  Isais Klipfel A. Epimenio FootSater, MD, [facility],FAAN 10/14/2019, 10:56 AM Certified in Neurology, Clinical Neurophysiology, Sleep Medicine and Neuroimaging  [facility] [address], [address] [place], [place] [address] [phone]

## 2019-10-15 ENCOUNTER — Telehealth: Payer: Self-pay | Admitting: *Deleted

## 2019-10-15 ENCOUNTER — Telehealth: Payer: Self-pay | Admitting: Neurology

## 2019-10-15 DIAGNOSIS — Z0289 Encounter for other administrative examinations: Secondary | ICD-10-CM

## 2019-10-15 NOTE — Telephone Encounter (Signed)
Called and spoke w/ pt. Advised I did not see copy of insurance cards scanned in from yesterday. She confirmed they did not get scanned in. She is unable to stop by office this week to drop of copy of insurance cards and does not have access to fax machine. I re-sent code for her to sign up for mychart. She will sign up and load copy of insurance cards through [place]. I will then print and fax with [facility] start form once available. Pt verbalized understanding and appreciation for call.

## 2019-10-15 NOTE — Telephone Encounter (Signed)
Faxed printed/signed [employer] start form to [place] at [phone]. Received fax confirmation. This will be year 2 for pt. She received year one 11/2019.

## 2019-10-15 NOTE — Telephone Encounter (Signed)
Tried initiating PA for [facility] on CMM. ATBAKNDA - PA Case ID: [id number]. Received the following response: "This medication or product is on your plan's list of covered drugs. Prior authorization is not required at this time. If your pharmacy has questions regarding the processing of your prescription, please have them call the [facility] pharmacy help desk at [phone]. **Please note: Formulary lowering, tiering exception, cost reduction and/or pre-benefit determination review (including prospective Medicare hospice reviews) requests cannot be requested using this method of submission. Please contact us at [phone] instead."

## 2019-10-15 NOTE — Telephone Encounter (Signed)
Patient is scheduled at [facility] point for 10/18/19.

## 2019-10-15 NOTE — Telephone Encounter (Signed)
MRI Thoracis is pending faxed notes.  MR Brain w/wo contrast [employer] Auth: [id number] (exp. 10/15/19 to 11/29/19) MR Lumbar spine wo contrast Auth: [id number] (exp. 10/15/19 to 11/29/19)

## 2019-10-15 NOTE — Telephone Encounter (Signed)
MR Thoracic spine wo contrast Auth: [id number] (exp. 10/15/19 to 11/29/19)   I Connie Vaughan with [facility] she is going to contact the patient to schedule.

## 2019-10-17 LAB — CBC WITH DIFFERENTIAL/PLATELET
Basophils Absolute: 0.1 10*3/uL (ref 0.0–0.2)
Basos: 1 %
EOS (ABSOLUTE): 0.3 10*3/uL (ref 0.0–0.4)
Eos: 3 %
Hematocrit: 44.6 % (ref 34.0–46.6)
Hemoglobin: 14.8 g/dL (ref 11.1–15.9)
Immature Grans (Abs): 0 10*3/uL (ref 0.0–0.1)
Immature Granulocytes: 0 %
Lymphocytes Absolute: 1.1 10*3/uL (ref 0.7–3.1)
Lymphs: 11 %
MCH: 30.4 pg (ref 26.6–33.0)
MCHC: 33.2 g/dL (ref 31.5–35.7)
MCV: 92 fL (ref 79–97)
Monocytes Absolute: 1 10*3/uL — ABNORMAL HIGH (ref 0.1–0.9)
Monocytes: 10 %
Neutrophils Absolute: 7.2 10*3/uL — ABNORMAL HIGH (ref 1.4–7.0)
Neutrophils: 75 %
Platelets: 351 10*3/uL (ref 150–450)
RBC: 4.87 x10E6/uL (ref 3.77–5.28)
RDW: 12.7 % (ref 11.7–15.4)
WBC: 9.7 10*3/uL (ref 3.4–10.8)

## 2019-10-17 LAB — QUANTIFERON-TB GOLD PLUS
QuantiFERON Mitogen Value: 2.21 IU/mL
QuantiFERON Nil Value: 0.05 IU/mL
QuantiFERON TB1 Ag Value: 0.03 IU/mL
QuantiFERON TB2 Ag Value: 0.05 IU/mL
QuantiFERON-TB Gold Plus: NEGATIVE

## 2019-10-17 LAB — HEPATITIS B SURFACE ANTIBODY,QUALITATIVE: Hep B Surface Ab, Qual: NONREACTIVE

## 2019-10-17 LAB — HEPATITIS B SURFACE ANTIGEN: Hepatitis B Surface Ag: NEGATIVE

## 2019-10-17 LAB — HEPATITIS B CORE ANTIBODY, TOTAL: Hep B Core Total Ab: NEGATIVE

## 2019-10-17 LAB — HIV ANTIBODY (ROUTINE TESTING W REFLEX): HIV Screen 4th Generation wRfx: NONREACTIVE

## 2019-10-17 LAB — VITAMIN D 25 HYDROXY (VIT D DEFICIENCY, FRACTURES): Vit D, 25-Hydroxy: 16.3 ng/mL — ABNORMAL LOW (ref 30.0–100.0)

## 2019-10-18 ENCOUNTER — Ambulatory Visit (HOSPITAL_BASED_OUTPATIENT_CLINIC_OR_DEPARTMENT_OTHER): Payer: 59

## 2019-10-20 ENCOUNTER — Telehealth: Payer: Self-pay | Admitting: *Deleted

## 2019-10-20 DIAGNOSIS — R7989 Other specified abnormal findings of blood chemistry: Secondary | ICD-10-CM

## 2019-10-20 MED ORDER — VITAMIN D (ERGOCALCIFEROL) 1.25 MG (50000 UNIT) PO CAPS: ORAL_CAPSULE | ORAL | 1 refills | Status: AC

## 2019-10-20 NOTE — Telephone Encounter (Signed)
-----   Message from Britt Bottom, MD sent at 10/20/2019  4:06 PM EDT ----- 1.   All of her lab work is fine to do the second year of [place] (she would like to do in January).  I think we had her fill out the paperwork at her visit last week.  If so I can sign and send it in.  If she did not fill 1 out we can mail or email one to her 2.   Her vitamin D is low and I would like her to do 50,000 units weekly x26 weeks (#13 #1 refill) and then do 5000 units OTC daily.

## 2019-10-25 ENCOUNTER — Ambulatory Visit (HOSPITAL_BASED_OUTPATIENT_CLINIC_OR_DEPARTMENT_OTHER)
Admission: RE | Admit: 2019-10-25 | Discharge: 2019-10-25 | Disposition: A | Discharge status: Home / Self Care | Payer: 59 | Source: Ambulatory Visit | Attending: Neurology | Admitting: Neurology

## 2019-10-25 ENCOUNTER — Other Ambulatory Visit: Payer: Self-pay

## 2019-10-25 DIAGNOSIS — R200 Anesthesia of skin: Secondary | ICD-10-CM | POA: Diagnosis present

## 2019-10-25 DIAGNOSIS — G35 Multiple sclerosis: Secondary | ICD-10-CM | POA: Insufficient documentation

## 2019-10-25 DIAGNOSIS — R29898 Other symptoms and signs involving the musculoskeletal system: Secondary | ICD-10-CM | POA: Diagnosis present

## 2019-10-25 DIAGNOSIS — M545 Low back pain, unspecified: Secondary | ICD-10-CM

## 2019-10-25 IMAGING — MR MR HEAD WO/W CM
15 series · 48 of 48 positions shown · IV contrast (gadavist)
Comparison: Brain MR [DATE]

CLINICAL DATA: History of multiple sclerosis. Left leg weakness and
tingling.

EXAM:
MRI HEAD WITHOUT AND WITH CONTRAST
TECHNIQUE: Multiplanar, multiecho pulse sequences of the brain and surrounding
structures were obtained without and with intravenous contrast.
CONTRAST:  10mL GADAVIST GADOBUTROL 1 MMOL/ML IV SOLN

[Series 21: T1 · sagittal · 5.0mm · 0.90mm/px · 1 of 27 slices shown (1 of 3)]
[im 1/27]
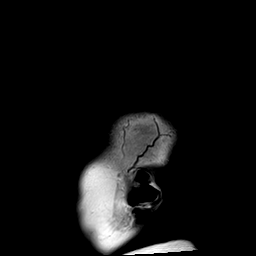

[Series 22: DWI · axial · 3.0mm · 1.88mm/px · z∈[+69,+221]mm · 7 of 96 slices shown (1 of 3)]
[im 1/96]
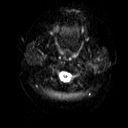
[im 16/96]
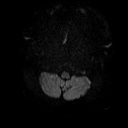
[im 32/96]
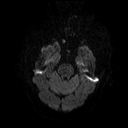
[im 48/96]
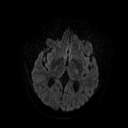
[im 64/96]
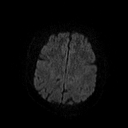
[im 80/96]
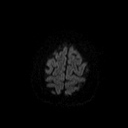
[im 96/96]
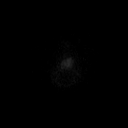

[Series 24: DWI · coronal · 3.0mm · 1.46mm/px · 6 of 90 slices shown (2 of 3)]
[im 1/90]
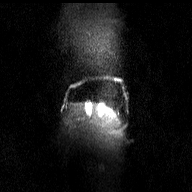
[im 18/90]
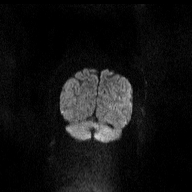
[im 36/90]
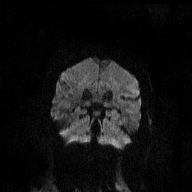
[im 54/90]
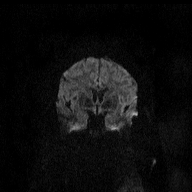
[im 72/90]
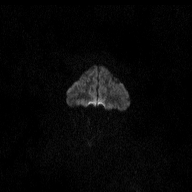
[im 90/90]
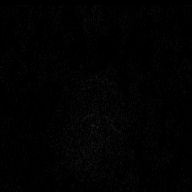

[Series 25: DWI · coronal · 3.0mm · 1.46mm/px · 3 of 44 slices shown (3 of 3)]
[im 1/44]
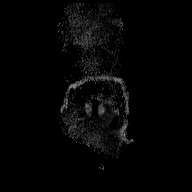
[im 22/44]
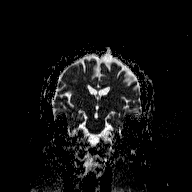
[im 44/44]
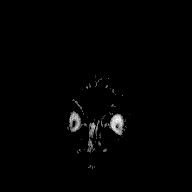

[Series 26: T2 · axial · 5.0mm · 0.69mm/px · z∈[+85,+228]mm · 2 of 26 slices shown (1 of 4)]
[im 1/26]
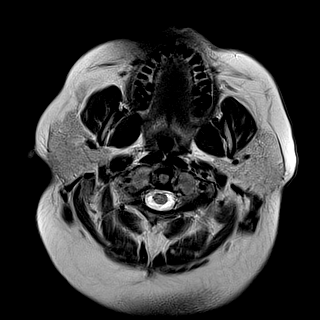
[im 26/26]
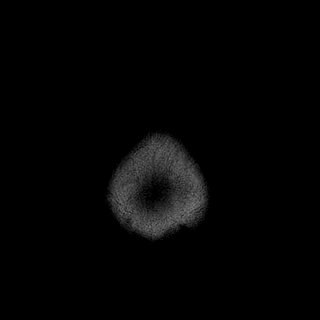

[Series 27: T2 · axial · 5.0mm · 0.43mm/px · z∈[+85,+228]mm · 2 of 26 slices shown (2 of 4)]
[im 1/26]
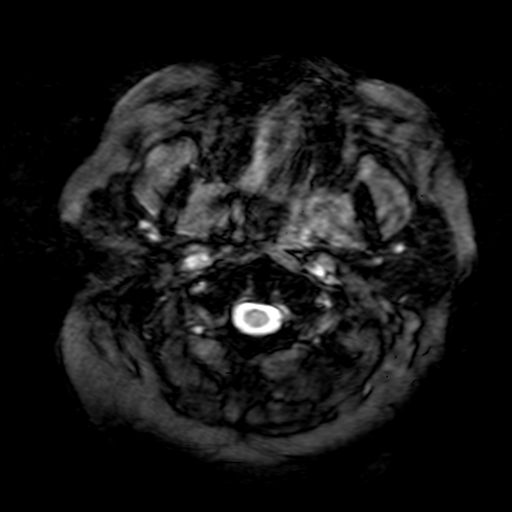
[im 26/26]
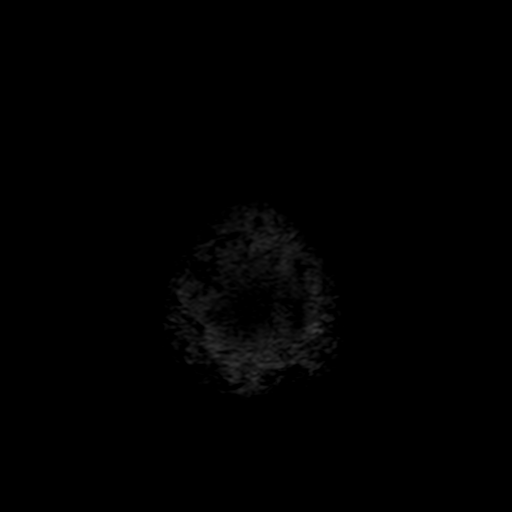

[Series 28: FLAIR · axial · 3.0mm · 0.43mm/px · z∈[+77,+233]mm · 3 of 42 slices shown (1 of 2)]
[im 1/42]
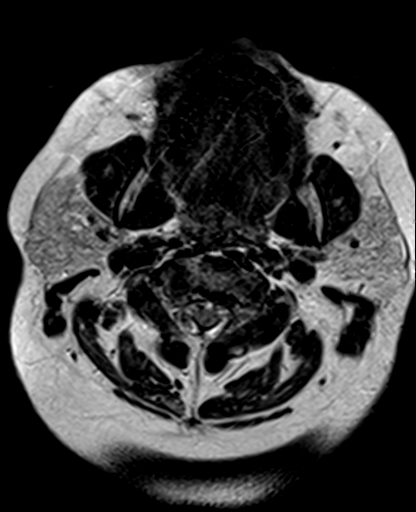
[im 21/42]
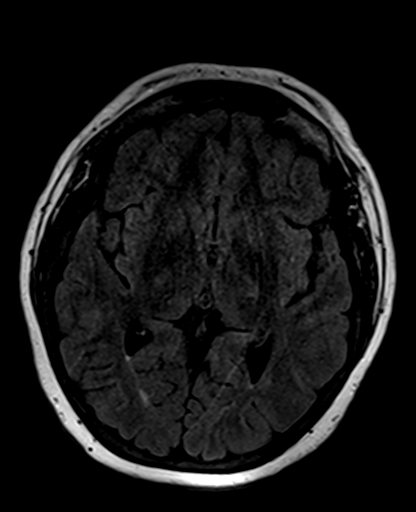
[im 42/42]
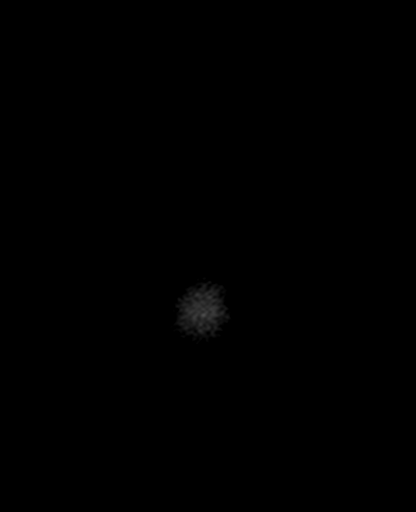

[Series 30: t1_3d_tra · axial · 2.0mm · 0.94mm/px · z∈[+85,+235]mm · 6 of 80 slices shown]
[im 1/80]
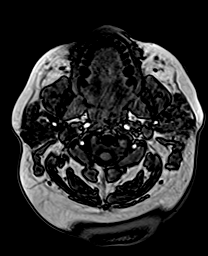
[im 16/80]
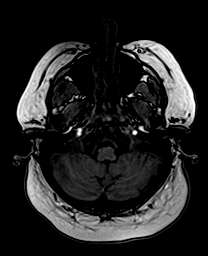
[im 32/80]
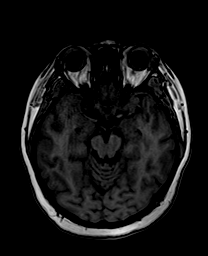
[im 48/80]
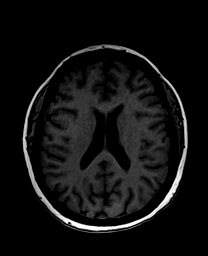
[im 64/80]
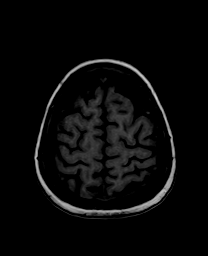
[im 80/80]
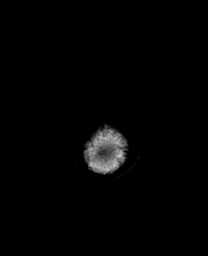

[Series 31: FLAIR · axial · 3.0mm · 0.43mm/px · z∈[+78,+234]mm · 3 of 42 slices shown (2 of 2)]
[im 1/42]
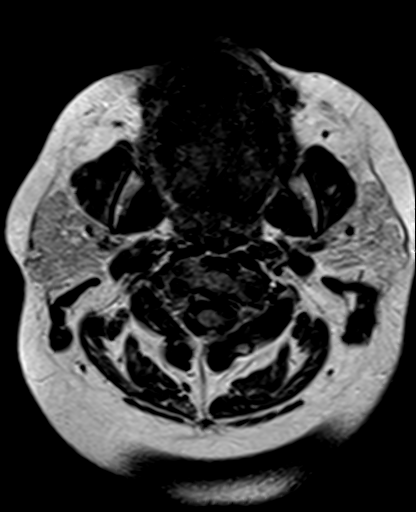
[im 21/42]
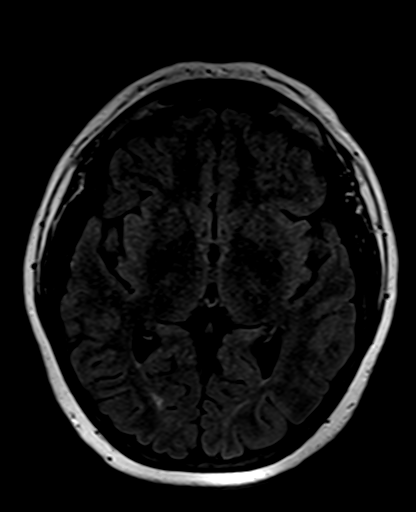
[im 42/42]
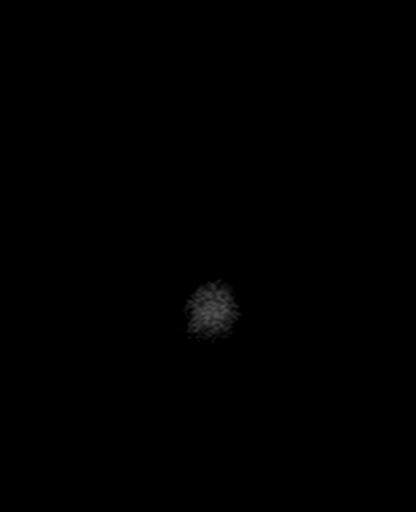

[Series 32: T2 · coronal · 5.0mm · 0.69mm/px · 1 of 19 slices shown (3 of 4)]
[im 1/19]
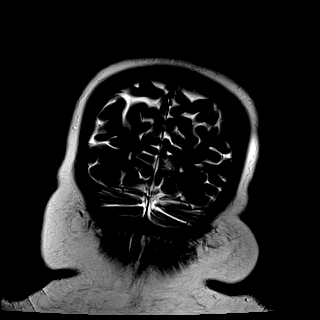

[Series 33: t1_3d_tra +c · axial · 2.0mm · 0.94mm/px · z∈[+85,+235]mm · 6 of 80 slices shown]
[im 1/80]
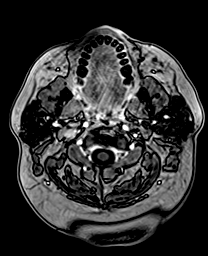
[im 16/80]
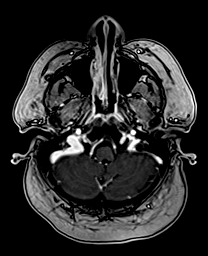
[im 32/80]
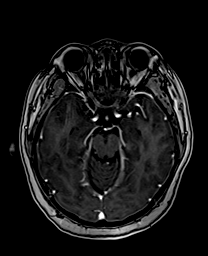
[im 48/80]
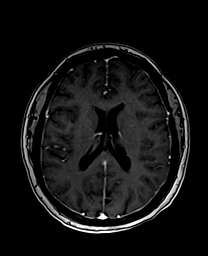
[im 64/80]
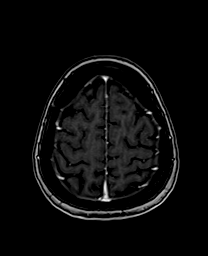
[im 80/80]
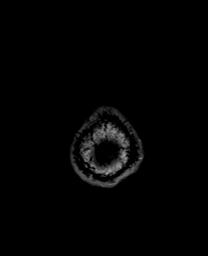

[Series 34: T2 · coronal · 5.0mm · 0.69mm/px · 2 of 28 slices shown (4 of 4)]
[im 1/28]
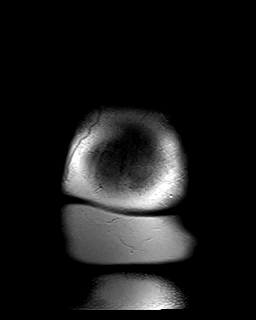
[im 28/28]
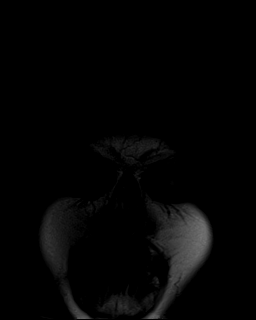

[Series 35: T1 · coronal · 5.0mm · 0.86mm/px · 2 of 28 slices shown (2 of 3)]
[im 1/28]
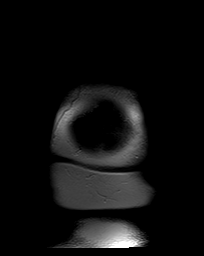
[im 28/28]
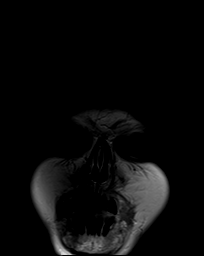

[Series 36: T1 · sagittal · 5.0mm · 0.45mm/px · 2 of 27 slices shown (3 of 3)]
[im 1/27]
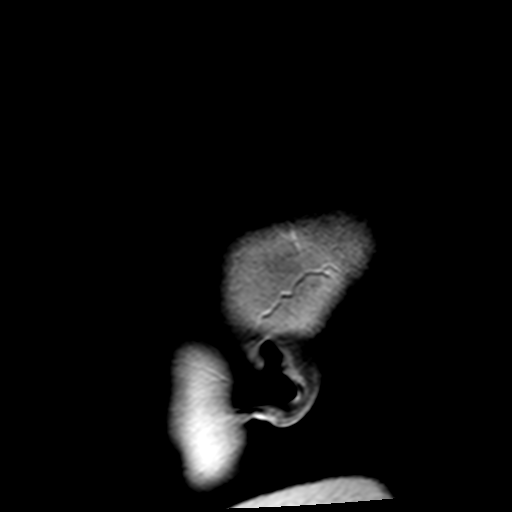
[im 27/27]
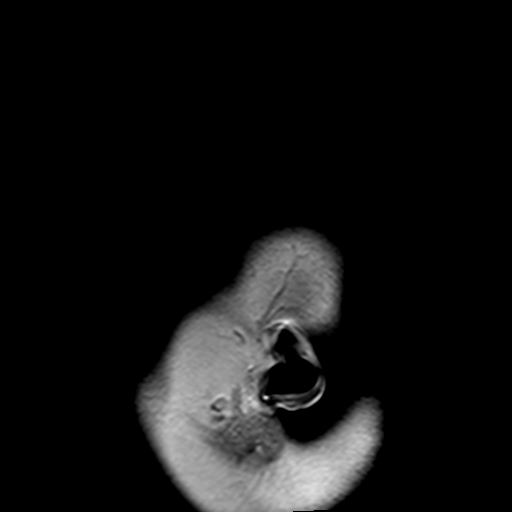

[Series 102: brain hx · axial · 4.0mm · 0.51mm/px · z∈[+102,+249]mm · 2 of 27 slices shown]
[im 1/27]
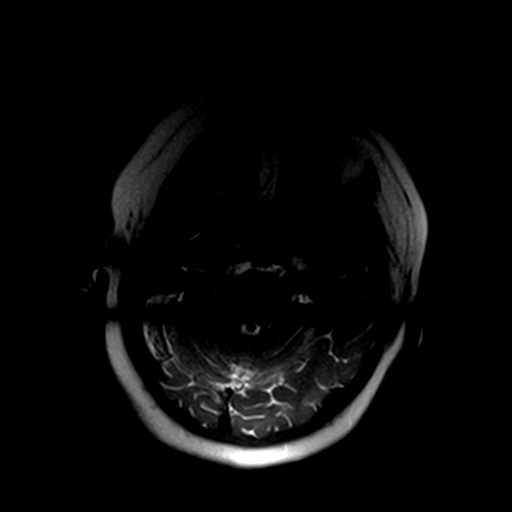
[im 27/27]
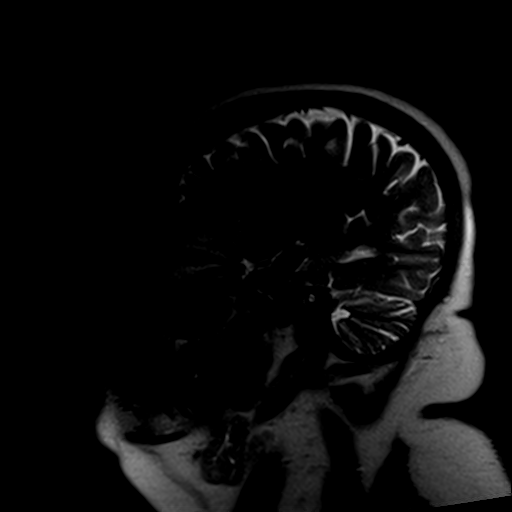

[48 of 48 positions shown; findings below may reference images not displayed]

FINDINGS: BRAIN: There is no acute infarct, acute hemorrhage or extra-axial
collection. There are 5 or fewer hyperintense T2-weighted signal
lesions within the periventricular white matter, most of which show
corresponding low T1-weighted signal. There are no infratentorial
lesions. The number, size and distribution of lesions is unchanged
from the most recent MRI. There are no contrast-enhancing lesions to
indicate active demyelination. The cerebral and cerebellar volume
are age-appropriate. There is no hydrocephalus. The midline
structures are normal.

VASCULAR: The major intracranial arterial and venous sinus flow
voids are normal. Susceptibility-sensitive sequences show no chronic
microhemorrhage or superficial siderosis.

SKULL AND UPPER CERVICAL SPINE: Calvarial bone marrow signal is
normal. There is no skull base mass. The visualized upper cervical
spine and soft tissues are normal.

SINUSES/ORBITS: There are no fluid levels or advanced mucosal
thickening. The mastoid air cells and middle ear cavities are free
of fluid. The orbits are normal.
IMPRESSION: 1. Unchanged distribution and distribution of white matter lesions
consistent with multiple sclerosis. No active demyelination.
2. No acute intracranial abnormality.

## 2019-10-25 IMAGING — MR MR LUMBAR SPINE W/O CM
4 series · 48 of 48 positions shown · non-contrast
Comparison: None.

CLINICAL DATA: History of multiple sclerosis. Low back pain with
left leg weakness.

EXAM:
MRI THORACIC AND LUMBAR SPINE WITHOUT CONTRAST
TECHNIQUE: Multiplanar and multiecho pulse sequences of the thoracic and lumbar
spine were obtained without intravenous contrast.

[Series 15: T2 · sagittal · 4.0mm · 1.02mm/px · 6 of 13 slices shown (1 of 2)]
[im 1/13]
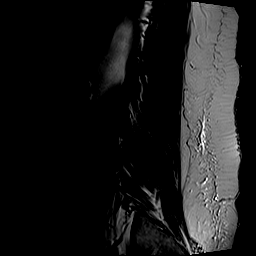
[im 3/13]
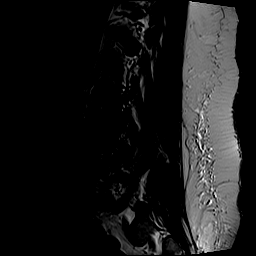
[im 5/13]
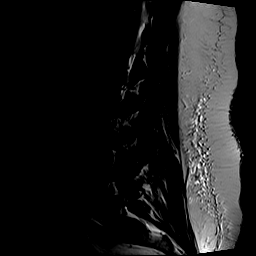
[im 8/13]
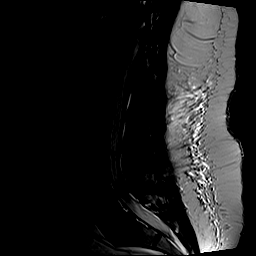
[im 10/13]
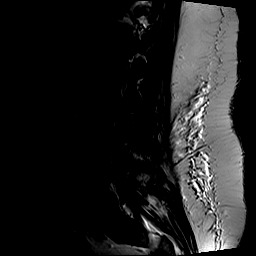
[im 13/13]
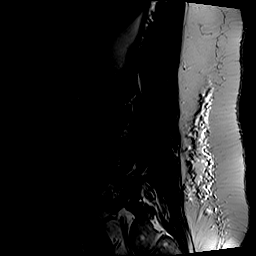

[Series 17: T1 · sagittal · 4.0mm · 1.02mm/px · 6 of 13 slices shown (1 of 2)]
[im 1/13]
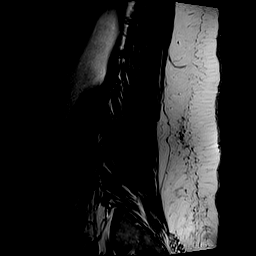
[im 3/13]
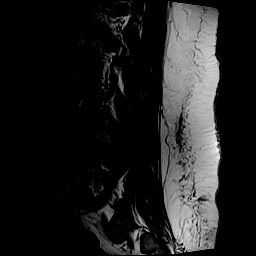
[im 5/13]
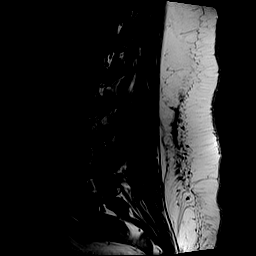
[im 8/13]
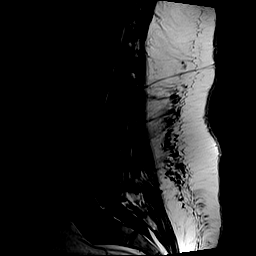
[im 10/13]
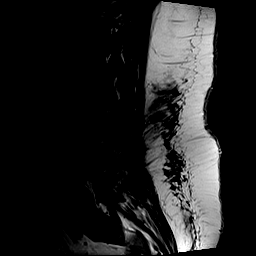
[im 13/13]
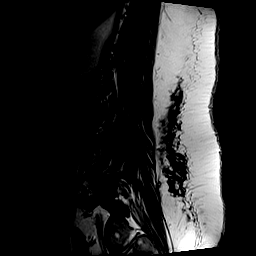

[Series 18: T2 · axial · 4.0mm · 0.78mm/px · z∈[-556,-342]mm · 18 of 36 slices shown (2 of 2)]
[im 1/36]
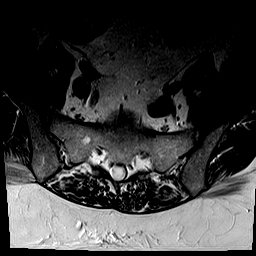
[im 3/36]
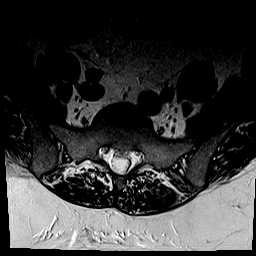
[im 5/36]
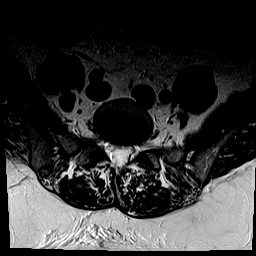
[im 7/36]
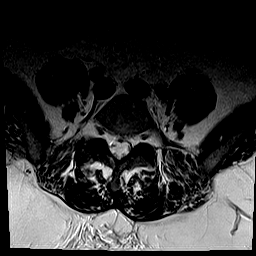
[im 9/36]
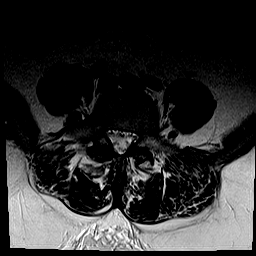
[im 11/36]
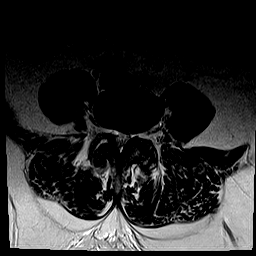
[im 13/36]
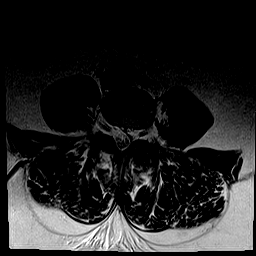
[im 15/36]
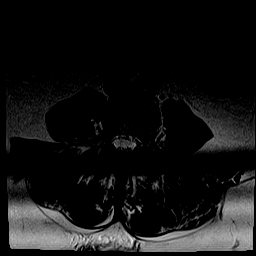
[im 17/36]
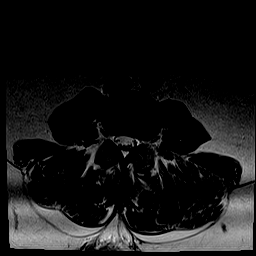
[im 19/36]
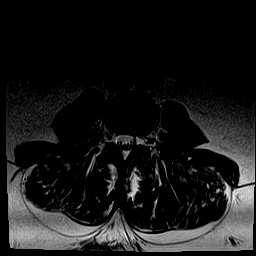
[im 21/36]
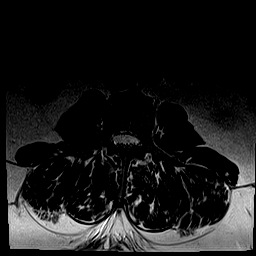
[im 23/36]
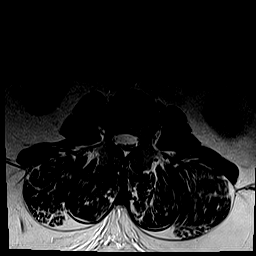
[im 25/36]
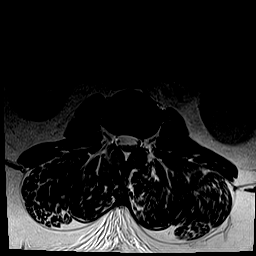
[im 27/36]
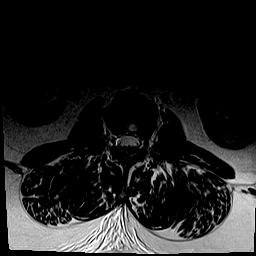
[im 29/36]
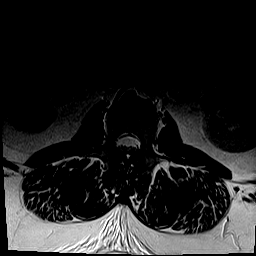
[im 31/36]
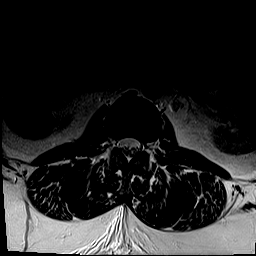
[im 33/36]
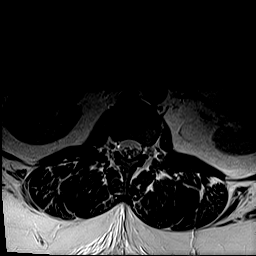
[im 36/36]
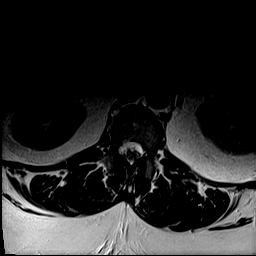

[Series 19: T1 · axial · 4.0mm · 0.78mm/px · z∈[-556,-342]mm · 18 of 36 slices shown (2 of 2)]
[im 1/36]
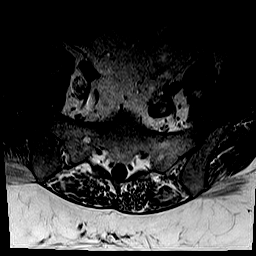
[im 3/36]
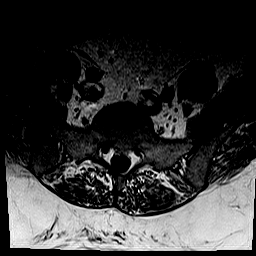
[im 5/36]
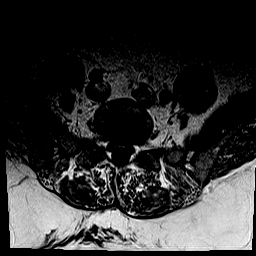
[im 7/36]
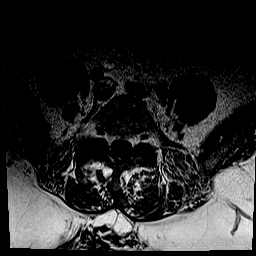
[im 9/36]
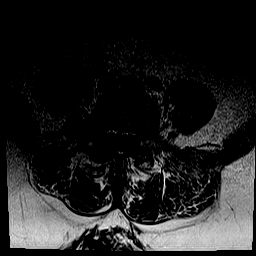
[im 11/36]
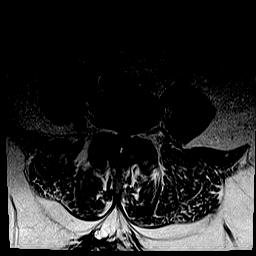
[im 13/36]
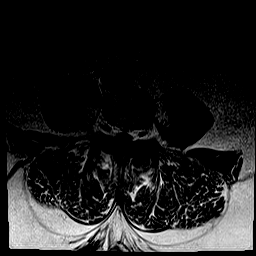
[im 15/36]
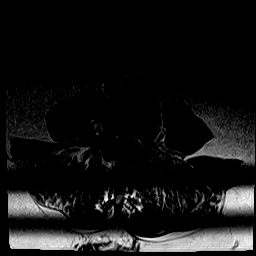
[im 17/36]
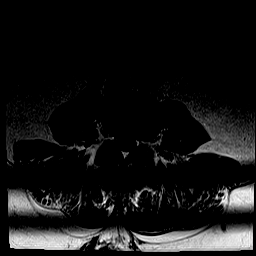
[im 19/36]
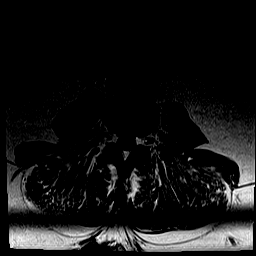
[im 21/36]
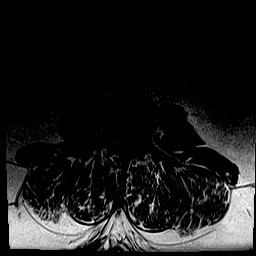
[im 23/36]
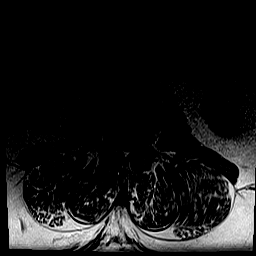
[im 25/36]
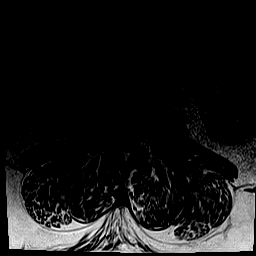
[im 27/36]
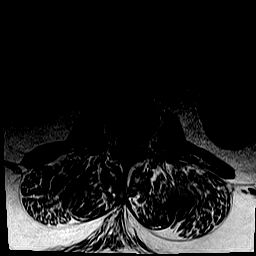
[im 29/36]
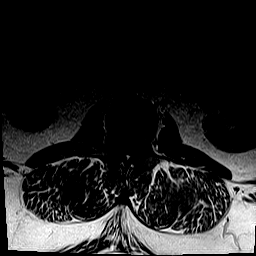
[im 31/36]
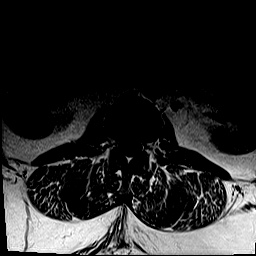
[im 33/36]
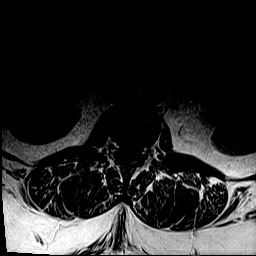
[im 36/36]
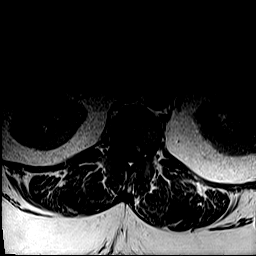

[48 of 48 positions shown; findings below may reference images not displayed]

FINDINGS: MRI THORACIC SPINE FINDINGS

Alignment:  Physiologic.

Vertebrae: No fracture, evidence of discitis, or bone lesion.

Cord: There is a short, 3 mm syrinx at the T7-8 level. Otherwise
normal.

Paraspinal and other soft tissues: Negative

Disc levels:

No disc herniation or stenosis

MRI LUMBAR SPINE FINDINGS

Segmentation:  Standard

Alignment:  Normal

Vertebrae:  Normal marrow signal.

Conus medullaris and cauda equina: Conus extends to the L1 level.
Conus and cauda equina appear normal.

Paraspinal and other soft tissues: Negative

Disc levels:

T12-L1: Normal disc space and facets. No spinal canal or
neuroforaminal stenosis.

L1-L2: Normal disc space and facets. No spinal canal or
neuroforaminal stenosis.

L2-L3: Normal disc space and facets. No spinal canal or
neuroforaminal stenosis.

L3-L4: Normal disc space and facets. No spinal canal or
neuroforaminal stenosis.

L4-L5: Small left subarticular disc protrusion. No spinal canal
stenosis. Severe bilateral facet hypertrophy. No neural foraminal
stenosis.

L5-S1: Moderate facet hypertrophy. No disc herniation. No spinal
canal or neural foraminal stenosis.

Visualized sacrum: Normal.
IMPRESSION: 1. No spinal cord or cauda equina lesions.
2. Small left subarticular disc protrusion at L4-L5 without
associated stenosis. This might irritate the L5 nerve root in the
lateral recess.
3. Severe bilateral facet arthrosis at L4-L5 and L5-S1, which may
serve as a source of local low back pain.

## 2019-10-25 IMAGING — MR MR THORACIC SPINE W/O CM
5 of 10 series · 24 of 48 positions shown · non-contrast
Comparison: None.

CLINICAL DATA: History of multiple sclerosis. Low back pain with
left leg weakness.

EXAM:
MRI THORACIC AND LUMBAR SPINE WITHOUT CONTRAST
TECHNIQUE: Multiplanar and multiecho pulse sequences of the thoracic and lumbar
spine were obtained without intravenous contrast.

[Series 5: T1 · sagittal · 3.0mm · 1.06mm/px · 4 of 11 slices shown (1 of 2)]
[im 1/11]
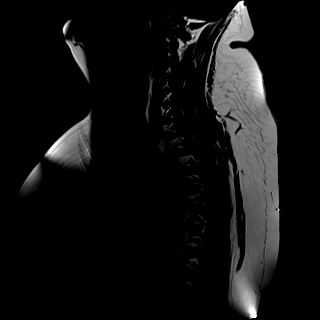
[im 4/11]
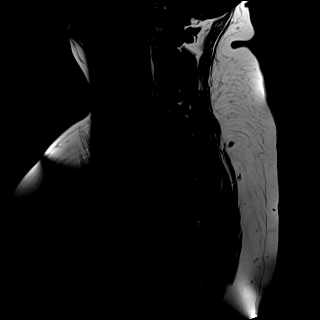
[im 7/11]
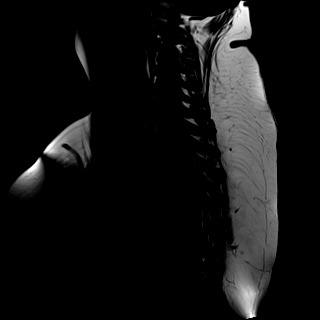
[im 11/11]
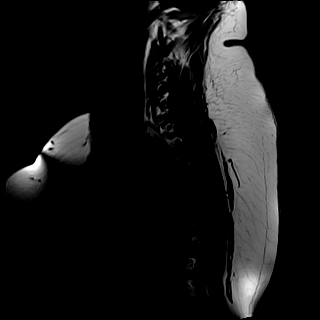

[Series 6: T2 · sagittal · 4.0mm · 1.29mm/px · 5 of 15 slices shown (1 of 3)]
[im 1/15]
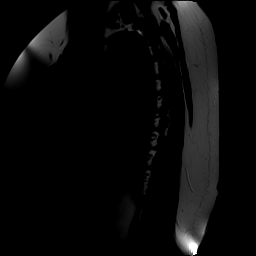
[im 4/15]
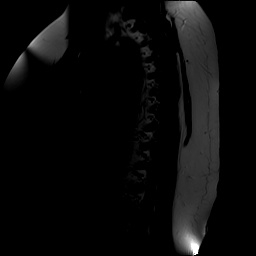
[im 8/15]
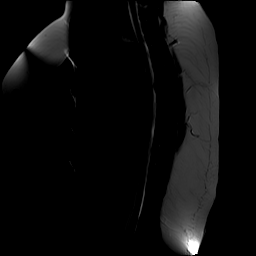
[im 11/15]
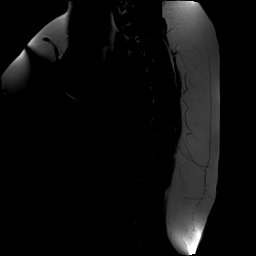
[im 15/15]
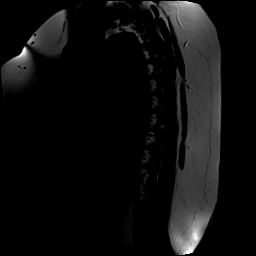

[Series 7: T1 · sagittal · 4.0mm · 0.64mm/px · 4 of 15 slices shown (2 of 2)]
[im 1/15]
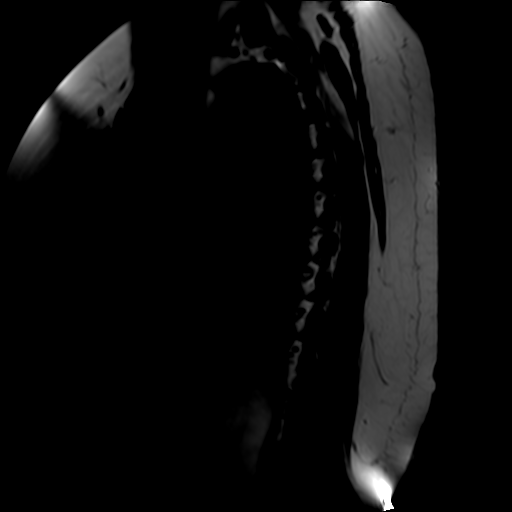
[im 4/15]
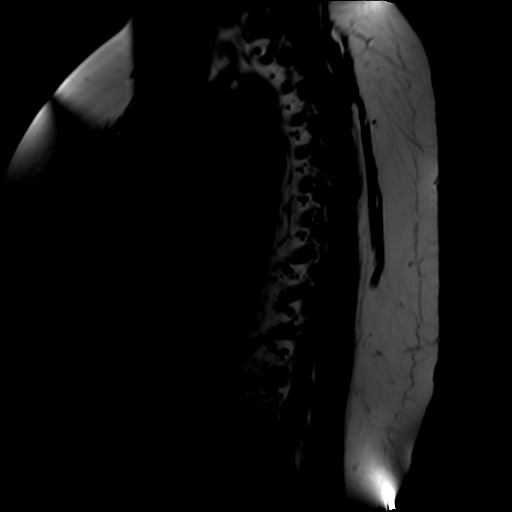
[im 8/15]
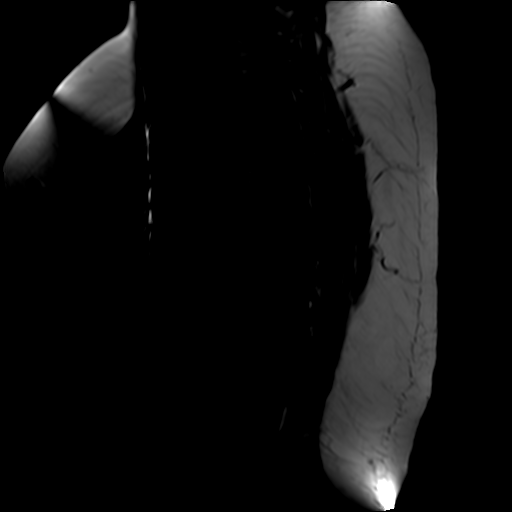
[im 11/15]
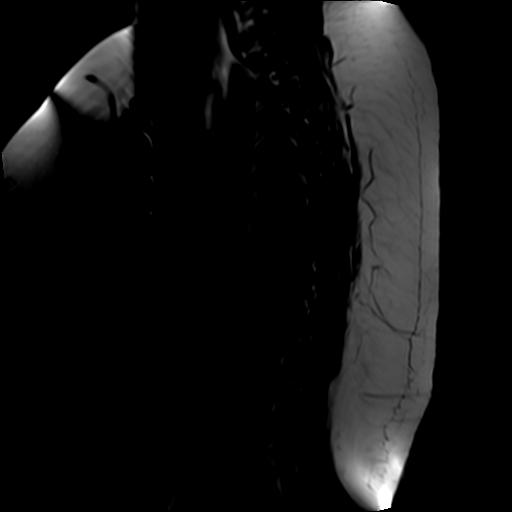

[Series 9: T2 · axial · 4.0mm · 0.41mm/px · z∈[-166,-70]mm · 5 of 18 slices shown (2 of 3)]
[im 1/18]
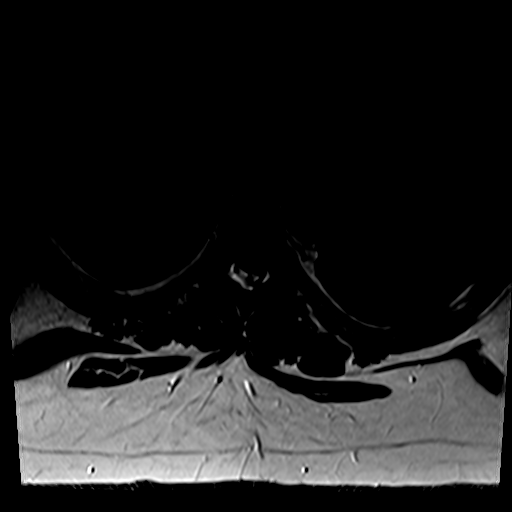
[im 5/18]
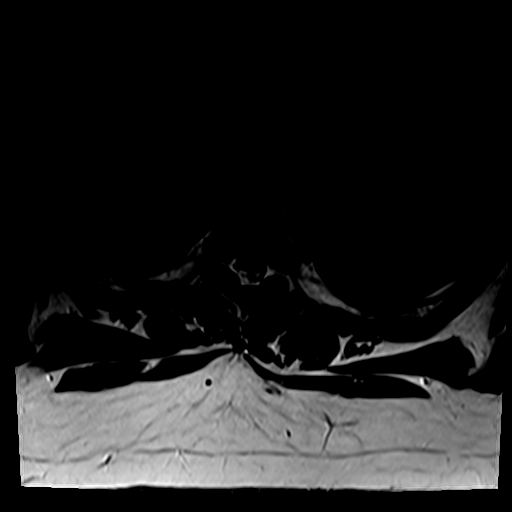
[im 9/18]
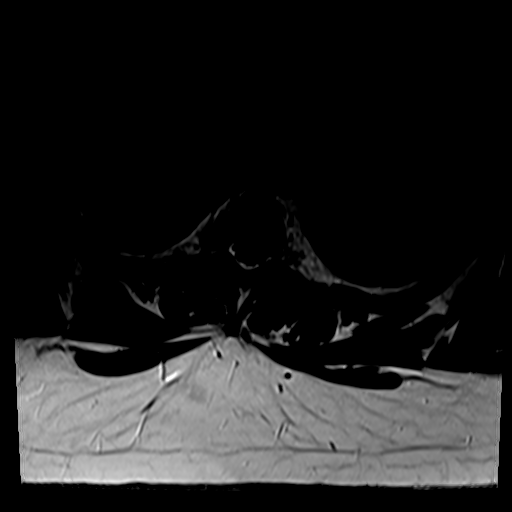
[im 13/18]
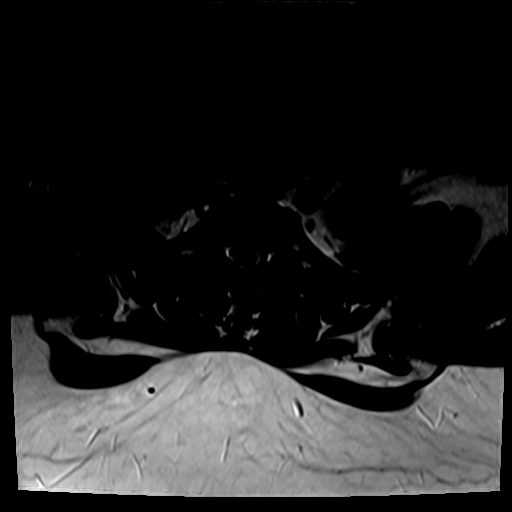
[im 18/18]
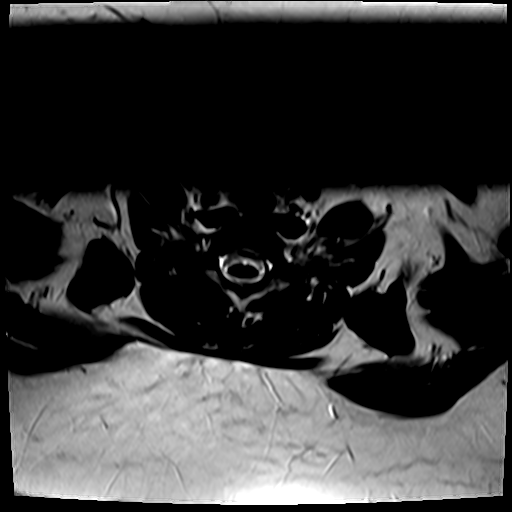

[Series 10: T2 · axial · 4.0mm · 0.41mm/px · z∈[-304,-163]mm · 6 of 21 slices shown (3 of 3)]
[im 1/21]
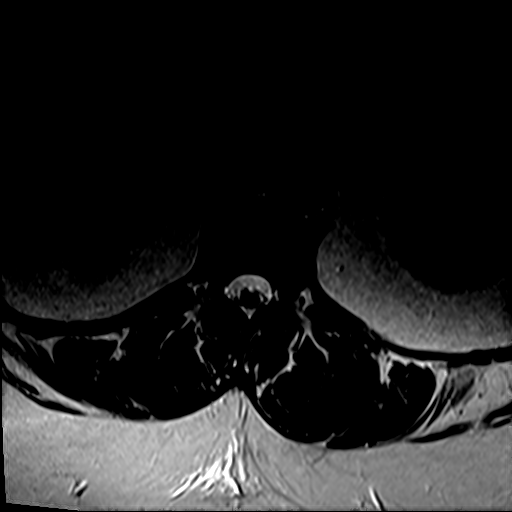
[im 5/21]
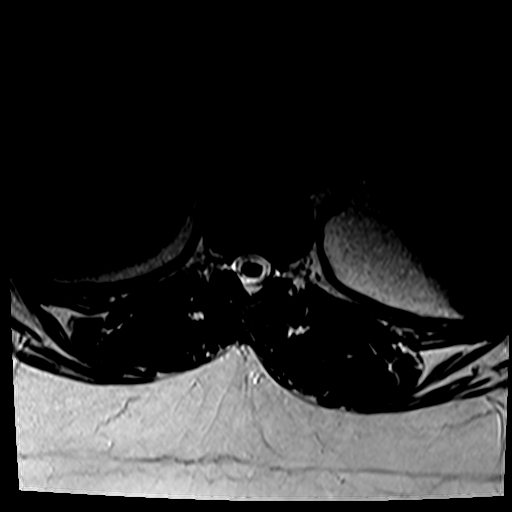
[im 9/21]
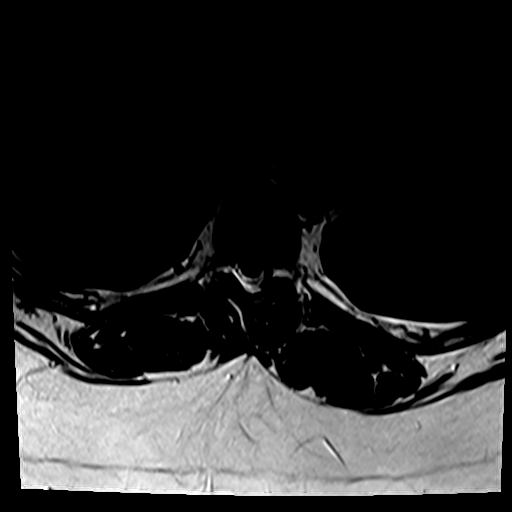
[im 13/21]
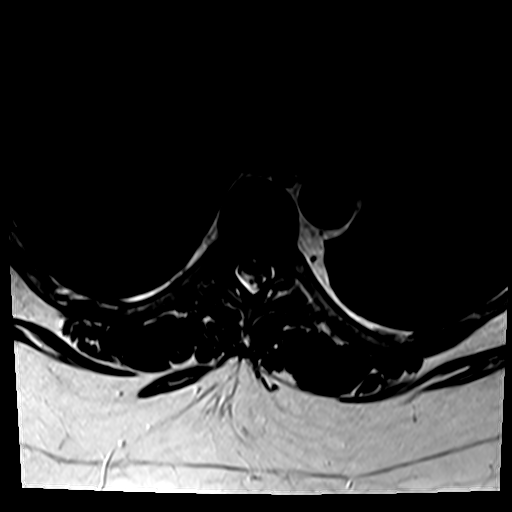
[im 17/21]
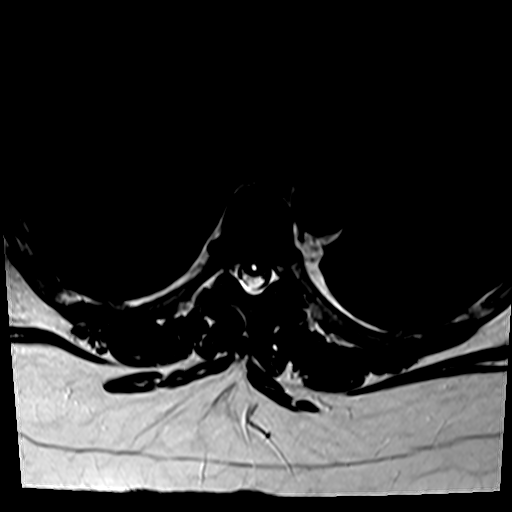
[im 21/21]
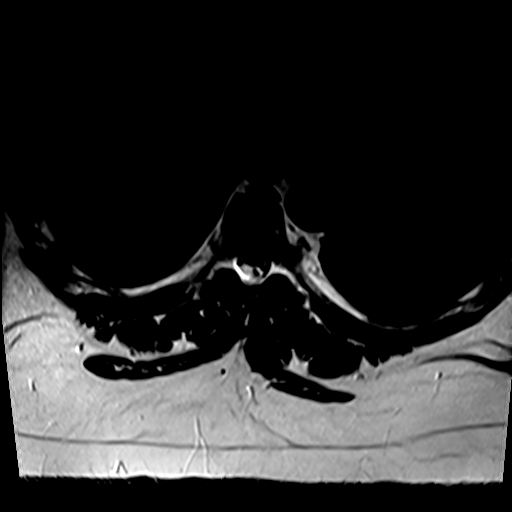

[24 of 48 positions shown; findings below may reference images not displayed]

FINDINGS: MRI THORACIC SPINE FINDINGS

Alignment:  Physiologic.

Vertebrae: No fracture, evidence of discitis, or bone lesion.

Cord: There is a short, 3 mm syrinx at the T7-8 level. Otherwise
normal.

Paraspinal and other soft tissues: Negative

Disc levels:

No disc herniation or stenosis

MRI LUMBAR SPINE FINDINGS

Segmentation:  Standard

Alignment:  Normal

Vertebrae:  Normal marrow signal.

Conus medullaris and cauda equina: Conus extends to the L1 level.
Conus and cauda equina appear normal.

Paraspinal and other soft tissues: Negative

Disc levels:

T12-L1: Normal disc space and facets. No spinal canal or
neuroforaminal stenosis.

L1-L2: Normal disc space and facets. No spinal canal or
neuroforaminal stenosis.

L2-L3: Normal disc space and facets. No spinal canal or
neuroforaminal stenosis.

L3-L4: Normal disc space and facets. No spinal canal or
neuroforaminal stenosis.

L4-L5: Small left subarticular disc protrusion. No spinal canal
stenosis. Severe bilateral facet hypertrophy. No neural foraminal
stenosis.

L5-S1: Moderate facet hypertrophy. No disc herniation. No spinal
canal or neural foraminal stenosis.

Visualized sacrum: Normal.
IMPRESSION: 1. No spinal cord or cauda equina lesions.
2. Small left subarticular disc protrusion at L4-L5 without
associated stenosis. This might irritate the L5 nerve root in the
lateral recess.
3. Severe bilateral facet arthrosis at L4-L5 and L5-S1, which may
serve as a source of local low back pain.

## 2019-10-25 MED ORDER — GADOBUTROL 1 MMOL/ML IV SOLN
10.0000 mL | Freq: Once | INTRAVENOUS | Status: AC | PRN
  Administered 2019-10-25: 15:00:00 10 mL via INTRAVENOUS

## 2019-10-27 ENCOUNTER — Telehealth: Payer: Self-pay | Admitting: *Deleted

## 2019-10-27 NOTE — Telephone Encounter (Signed)
-----   Message from Britt Bottom, MD sent at 10/26/2019  5:21 PM EST ----- Please let her know: MRI of the brain looks good -- no new lesions  MRI of the thoracic spine shows no MS and no degenerative change.  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 Small cysts like this are usually asymptomatic.  MRI of the lumbar spine shows degenerative changes at L4L5 and L5S1 with a disc protrusion at L4L5 towards the left.  The degenerative changes crowd the left L5 nerve root but does not compress it.

## 2019-11-05 ENCOUNTER — Telehealth: Payer: Self-pay | Admitting: *Deleted

## 2019-11-05 NOTE — Telephone Encounter (Signed)
Received fax notification from [facility] that pt has scheduled delivery of medication and should receive on 11/06/2019. [facility] ID# [id number]

## 2019-12-01 ENCOUNTER — Other Ambulatory Visit: Payer: Self-pay | Admitting: *Deleted

## 2019-12-01 MED ORDER — METHYLPHENIDATE HCL 10 MG PO TABS: 40.0000 mg | ORAL_TABLET | Freq: Every day | ORAL | 0 refills | Status: DC

## 2019-12-22 ENCOUNTER — Telehealth: Payer: Self-pay | Admitting: *Deleted

## 2019-12-22 NOTE — Telephone Encounter (Signed)
Received fax notification from [facility] that pt has scheduled delivery of medication and should receive on 12/24/2019 [facility] ID# [id number]

## 2020-01-05 ENCOUNTER — Other Ambulatory Visit: Payer: Self-pay | Admitting: *Deleted

## 2020-01-05 MED ORDER — TRAMADOL HCL 50 MG PO TABS: ORAL_TABLET | ORAL | 2 refills | Status: DC

## 2020-01-19 ENCOUNTER — Telehealth: Payer: Self-pay | Admitting: *Deleted

## 2020-01-19 NOTE — Telephone Encounter (Signed)
Received fax notification from [employer] that pt has completed the second year of treatment w/ [employer]. [employer] ID: [id number].

## 2020-02-29 ENCOUNTER — Other Ambulatory Visit: Payer: Self-pay | Admitting: Neurology

## 2020-03-16 ENCOUNTER — Encounter: Payer: Self-pay | Admitting: Neurology

## 2020-03-16 ENCOUNTER — Ambulatory Visit (INDEPENDENT_AMBULATORY_CARE_PROVIDER_SITE_OTHER): Payer: 59 | Admitting: Neurology

## 2020-03-16 ENCOUNTER — Other Ambulatory Visit: Payer: Self-pay

## 2020-03-16 VITALS — BP 110/85 | HR 58 | Temp 96.9°F | Ht 64.0 in | Wt 285.6 lb

## 2020-03-16 DIAGNOSIS — G35 Multiple sclerosis: Secondary | ICD-10-CM | POA: Diagnosis not present

## 2020-03-16 DIAGNOSIS — R5383 Other fatigue: Secondary | ICD-10-CM | POA: Diagnosis not present

## 2020-03-16 DIAGNOSIS — Z79899 Other long term (current) drug therapy: Secondary | ICD-10-CM

## 2020-03-16 NOTE — Progress Notes (Signed)
[facility]  PATIENT: Connie Vaughan DOB: 1971/10/24  REFERRING DOCTOR OR PCP:  Tobie Lords, PA SOURCE: Patient, notes from Dr. Daphane Shepherd, imaging and lab reports, MRI of the brain from 2019 personally reviewed.  _________________________________   HISTORICAL  CHIEF COMPLAINT:  Chief Complaint  Patient presents with  . Follow-up    RM 16 with husband.  Last seen 10/14/2019.   . Multiple Sclerosis    On [place]. Completed second year 12/2019.  . Leg Pain    Takes lamotrigine  . Migraine    Takes emgality, tolerating well. Feels migraines have improved on this. Has taken a lot let relpax since being on this.   . Fatigue    Takes ritalin    HISTORY OF PRESENT ILLNESS:  She is a 49 y.o. woman with relapsing remitting multiple sclerosis diagnosed in 2000  Update 03/16/2020: She feels her MS has been well controlled.   She did her second year of cladribine in December and January.   She had been on Tecfidera but stopped due to GI AEs.    She notes no new neurologic symptom and no recent exacerbation.   She does have a mild left foot drop and needs to rest after 5200 yards.  She has burning dysesthesias in the left leg and some numbness.  She has pain in her legs increased by walking.  Gabapentin 800 mg p.o. 4 times daily helps some.  Bladder function is fine.  Vision in the left eye return to normal but she sometimes notes mild changes in color.  Emgality has helped the migraine frequency and intensity.    She has not needed to take a triptan in a few months but still has milder headaches.  She has sleep maintenance insomnia.   She is tired the next day if she does not sleep well.  She notes some fatigue.  Mood is doing about the same with some irritability when she has more discomfort.  She denies cognitive dysfunction but is sometimes distractible.  I had the pleasure of seeing patient, Connie Vaughan, at the MS center at [facility] for  neurologic consultation regarding her multiple sclerosis.  I reviewed the MRI of the brain, lumbar spine and thoracic spine performed 10/26/2019.  There were no new MS lesions.  MRI of the lumbar spine did show significant degenerative changes with facet hypertrophy at L4-L5 and L5-S1.  MS history: She presented with left optic neuritis in 1999 or 2000.   After a few months, symptoms resolved.  At the time, MRI was reportedly normal.  Two years later, she had diplopia and MRI showed a few lesions not present in 2000.   She received steroids and started Betaseron   She stopped a while while pregnant in 2006.   She had an exacerbation with left foot drop and fatigue in 2007 and also had trouble tolerating Betaseron.  She was switched to Copaxone but had site reactions and subcutaneous nodules.  Initially, she was seeing Dr. Tinnie Gens at [facility].  I saw her for a couple years in 2014 through 2015.  I switched her to Tecfidera.    She switched to see Dr. Judy Pimple after I left [place] to go to [place]..  She switched to [facility] December 2019 and completed the second year of [facility] January 2021.  Imaging studies: MRI of the brain 08/07/2018.   It shows a few periventricular lesions and one in the anterior right medulla.  MRI cervical spine 11/02/2016, by  report, showed a focus at C6 compatible with MS.     She reports a lumbar spine MRI showing degnerative changes and mild spinal stenosis at one level.    She also has had hip bursitis on the left.    MRI of the brain, lumbar spine and thoracic spine 10/26/2019: The MRI of the brain showed foci in the hemispheres and medulla, unchanged compared to the previous MRI.  There were no enhancing lesions.  MRI of the thoracic spine was normal.  MRI of the lumbar spine showed a normal conus medullaris and cauda equina.  There was a disc protrusion at L4-L5 that could irritate the L5 nerve root.  There was severe bilateral facet hypertrophy at L4-L5 and  L5-S1.   REVIEW OF SYSTEMS: Constitutional: No fevers, chills, sweats, or change in appetite.  She has fatigue.  She gets frequent headaches Eyes: No visual changes, double vision, eye pain Ear, nose and throat: No hearing loss, ear pain, nasal congestion, sore throat Cardiovascular: No chest pain, palpitations Respiratory: No shortness of breath at rest or with exertion.   No wheezes GastrointestinaI: No nausea, vomiting, diarrhea, abdominal pain, fecal incontinence Genitourinary: No dysuria, urinary retention or frequency.  No nocturia. Musculoskeletal:Lower back pain as above Integumentary: No rash, pruritus, skin lesions Neurological: as above Psychiatric: No depression at this time.  No anxiety Endocrine: No palpitations, diaphoresis, change in appetite, change in weigh or increased thirst Hematologic/Lymphatic: No anemia, purpura, petechiae. Allergic/Immunologic: No itchy/runny eyes, nasal congestion, recent allergic reactions, rashes  ALLERGIES: Allergies  Allergen Reactions  . Sulfonamide Derivatives Hives and Rash    HOME MEDICATIONS:  Current Outpatient Medications:  .  aspirin-acetaminophen-caffeine (EXCEDRIN MIGRAINE) 250-250-65 MG tablet, Take by mouth every 6 (six) hours as needed for headache., Disp: , Rfl:  .  Cladribine, 10 Tabs, (MAVENCLAD, 10 TABS,) 10 MG TBPK, Take by mouth., Disp: , Rfl:  .  DULoxetine (CYMBALTA) 30 MG capsule, Take 30 mg by mouth at bedtime., Disp: , Rfl:  .  eletriptan (RELPAX) 40 MG tablet, Take 40 mg by mouth as needed for migraine or headache. May repeat in 2 hours if headache persists or recurs., Disp: , Rfl:  .  gabapentin (NEURONTIN) 800 MG tablet, Take 800 mg by mouth 4 (four) times daily., Disp: , Rfl:  .  Galcanezumab-gnlm (EMGALITY) 120 MG/ML SOAJ, Inject 1 pen into the skin every 28 (twenty-eight) days., Disp: 3 pen, Rfl: 4 .  ibuprofen (ADVIL) 800 MG tablet, Take 800 mg by mouth 3 (three) times daily. 1-3 tablets TID po, Disp:  , Rfl:  .  lamoTRIgine (LAMICTAL) 100 MG tablet, Take 1 tablet (100 mg total) by mouth 2 (two) times daily., Disp: 60 tablet, Rfl: 5 .  Melatonin 3 MG TABS, Take by mouth., Disp: , Rfl:  .  methylphenidate (RITALIN) 10 MG tablet, Take 4 tablets (40 mg total) by mouth daily., Disp: 120 tablet, Rfl: 0 .  metoprolol succinate (TOPROL-XL) 25 MG 24 hr tablet, Take 25 mg by mouth daily., Disp: , Rfl:  .  tiZANidine (ZANAFLEX) 4 MG tablet, Take 4 mg by mouth every 8 (eight) hours as needed for muscle spasms., Disp: , Rfl:  .  traMADol (ULTRAM) 50 MG tablet, One or two po qd prn headache or leg pain, Disp: 30 tablet, Rfl: 2 .  Vitamin D, Ergocalciferol, (DRISDOL) 1.25 MG (50000 UT) CAPS capsule, Take 50,000 units weeklyx26 weeks and then do 5000 units OTC daily thereafter, Disp: 13 capsule, Rfl: 1 .  vortioxetine HBr (  TRINTELLIX) 10 MG TABS tablet, Take 10 mg by mouth daily., Disp: , Rfl:   PAST MEDICAL HISTORY: Past Medical History:  Diagnosis Date  . Foot drop left  . Multiple sclerosis (HCC)   . Optic neuritis     PAST SURGICAL HISTORY: Past Surgical History:  Procedure Laterality Date  . ABDOMINAL HYSTERECTOMY    . CESAREAN SECTION    . SHOULDER SURGERY Right   . WRIST SURGERY Left     FAMILY HISTORY: No family history on file.  SOCIAL HISTORY:  Social History   Socioeconomic History  . Marital status: Married    Spouse name: Not on file  . Number of children: 1  . Years of education: 39  . Highest education level: Not on file  Occupational History  . Not on file  Tobacco Use  . Smoking status: Never Smoker  . Smokeless tobacco: Never Used  Substance and Sexual Activity  . Alcohol use: Yes    Comment: Once a week or less  . Drug use: Never  . Sexual activity: Not on file  Other Topics Concern  . Not on file  Social History Narrative   Right handed    Caffeine use: daily (2 Mt Dews per day)   Lives with husband   Social Determinants of Health   Financial Resource  Strain:   . Difficulty of Paying Living Expenses:   Food Insecurity:   . Worried About Programme researcher, broadcasting/film/video in the Last Year:   . Barista in the Last Year:   Transportation Needs:   . Freight forwarder (Medical):   Marland Kitchen Lack of Transportation (Non-Medical):   Physical Activity:   . Days of Exercise per Week:   . Minutes of Exercise per Session:   Stress:   . Feeling of Stress :   Social Connections:   . Frequency of Communication with Friends and Family:   . Frequency of Social Gatherings with Friends and Family:   . Attends Religious Services:   . Active Member of Clubs or Organizations:   . Attends Banker Meetings:   Marland Kitchen Marital Status:   Intimate Partner Violence:   . Fear of Current or Ex-Partner:   . Emotionally Abused:   Marland Kitchen Physically Abused:   . Sexually Abused:      PHYSICAL EXAM  Vitals:   03/16/20 1551  BP: 110/85  Pulse: (!) 58  Temp: (!) 96.9 F (36.1 C)  SpO2: 98%  Weight: 285 lb 9.6 oz (129.5 kg)  Height: 5\' 4"  (1.626 m)    Body mass index is 49.02 kg/m.   General: The patient is well-developed and well-nourished and in no acute distress  HEENT:  Head is [place]/AT.  Sclera are anicteric.  Funduscopic exam shows mild optic nerve pallor on the left.  Normal retinal vessels.  Neck: No carotid bruits are noted.  The neck is nontender.  Cardiovascular: The heart has a regular rate and rhythm with a normal S1 and S2. There were no murmurs, gallops or rubs.    Skin: Extremities are without rash or  edema.  Musculoskeletal:  Back is nontender  Neurologic Exam  Mental status: The patient is alert and oriented x 3 at the time of the examination. The patient has apparent normal recent and remote memory, with an apparently normal attention span and concentration ability.   Speech is normal.  Cranial nerves: Extraocular movements are full.  She has a mild APD on the left.  Color  vision is reduced on the left..  Facial symmetry is present.  There is good facial sensation to soft touch bilaterally.Facial strength is normal.  Trapezius and sternocleidomastoid strength is normal. No dysarthria is noted.  The tongue is midline, and the patient has symmetric elevation of the soft palate. No obvious hearing deficits are noted.  Motor:  Muscle bulk is normal.   Tone is normal. Strength is  5 / 5 in all 4 extremities except 4/5 in the left EHL of the foot.  Rapid altering movements were performed slower on the left than the right..   Sensory: Sensory testing is intact to pinprick, soft touch and vibration sensation in all 4 extremities.  Coordination: Cerebellar testing reveals good finger-nose-finger and mildly reduced left heel-to-shin .  Gait and station: Station is normal.   The gait is mildly wide with a slight left foot drop and the tandem gait is wide.. Romberg is negative.   Reflexes: Deep tendon reflexes are symmetric and normal bilaterally.         ASSESSMENT AND PLAN  No diagnosis found. In summary, Ms. Trammel is a 49 year old woman who had optic neuritis around 2000 and was diagnosed with MS about 2002.  She is currently on [place] and is due for her next treatment at the end of the year.  Due to the holidays she will plan on pushing this off until January.  I will check blood work to rule out chronic infections to allow her to get the second dose.  We will also recheck a lymphocyte count to make sure that it is 0.8 or higher.  She has tolerated the [place] well and hopefully will be able to go many years without another treatment.  Currently, her biggest concern is her left leg pain.  She also has noted a little more weakness and numbness in the left leg.  Most likely, this is due to her MS.  She has never had a thoracic spine MRI and we need to determine if the plaque there is causing her symptoms as the leg is much worse than the arm.  Additionally, because she is having pain out of proportion to numbness symptoms could  be due to a radiculopathy and we will check an MRI of the lumbar spine to determine if this symptom could be treated by epidural or other procedure.  She is currently scheduled to have an MRI of the brain in December.  We will see if we can get oh the studies done at the same time to be more convenient for her.  I will have her start lamotrigine as it is often helpful for dysesthetic pain associated with MS.  If not better, consider oxcarbazepine.  Also, to help with the pain, I sent in a prescription for tramadol to take as needed.  Another problem is migraines.  She has migraine headaches more than 15 days a month for more than 4 hours a day and might benefit from prophylactic treatment.  We discussed options.  Oral agents have not helped her much in the past.  I gave her samples of Emgality.  And she will refill if she gets a benefit.  Emgality [phone] [place]   02/2021).    I also filled out FMLA paperwork for up to 2 days a month for MS related symptoms or migraine.  She will return to see me in 5 months (this will coincide with 15-month lab work after her second year of [place]) or sooner if he has new or  worsening neurologic symptoms.  Moksh Loomer A. Epimenio Foot, MD, [facility] 03/16/2020, 4:12 PM Certified in Neurology, Clinical Neurophysiology, Sleep Medicine and Neuroimaging  [facility] [address], [address] [place], [place] [address] [phone]

## 2020-03-17 LAB — COMPREHENSIVE METABOLIC PANEL
ALT: 20 IU/L (ref 0–32)
AST: 14 IU/L (ref 0–40)
Albumin/Globulin Ratio: 1.6 (ref 1.2–2.2)
Albumin: 4.6 g/dL (ref 3.8–4.8)
Alkaline Phosphatase: 114 IU/L (ref 39–117)
BUN/Creatinine Ratio: 27 — ABNORMAL HIGH (ref 9–23)
BUN: 26 mg/dL — ABNORMAL HIGH (ref 6–24)
Bilirubin Total: 0.2 mg/dL (ref 0.0–1.2)
CO2: 24 mmol/L (ref 20–29)
Calcium: 9.5 mg/dL (ref 8.7–10.2)
Chloride: 104 mmol/L (ref 96–106)
Creatinine, Ser: 0.95 mg/dL (ref 0.57–1.00)
GFR calc Af Amer: 83 mL/min/{1.73_m2} (ref >59)
GFR calc non Af Amer: 72 mL/min/{1.73_m2} (ref >59)
Globulin, Total: 2.8 g/dL (ref 1.5–4.5)
Glucose: 98 mg/dL (ref 65–99)
Potassium: 5 mmol/L (ref 3.5–5.2)
Sodium: 142 mmol/L (ref 134–144)
Total Protein: 7.4 g/dL (ref 6.0–8.5)

## 2020-03-17 LAB — CBC WITH DIFFERENTIAL/PLATELET
Basophils Absolute: 0.1 10*3/uL (ref 0.0–0.2)
Basos: 1 %
EOS (ABSOLUTE): 0.3 10*3/uL (ref 0.0–0.4)
Eos: 4 %
Hematocrit: 40.8 % (ref 34.0–46.6)
Hemoglobin: 14.1 g/dL (ref 11.1–15.9)
Immature Grans (Abs): 0 10*3/uL (ref 0.0–0.1)
Immature Granulocytes: 0 %
Lymphocytes Absolute: 0.4 10*3/uL — ABNORMAL LOW (ref 0.7–3.1)
Lymphs: 4 %
MCH: 32 pg (ref 26.6–33.0)
MCHC: 34.6 g/dL (ref 31.5–35.7)
MCV: 93 fL (ref 79–97)
Monocytes Absolute: 0.8 10*3/uL (ref 0.1–0.9)
Monocytes: 9 %
Neutrophils Absolute: 7.3 10*3/uL — ABNORMAL HIGH (ref 1.4–7.0)
Neutrophils: 82 %
Platelets: 338 10*3/uL (ref 150–450)
RBC: 4.4 x10E6/uL (ref 3.77–5.28)
RDW: 12.8 % (ref 11.7–15.4)
WBC: 8.9 10*3/uL (ref 3.4–10.8)

## 2020-04-01 ENCOUNTER — Other Ambulatory Visit: Payer: Self-pay | Admitting: Neurology

## 2020-04-14 ENCOUNTER — Other Ambulatory Visit: Payer: Self-pay | Admitting: Neurology

## 2020-06-24 ENCOUNTER — Other Ambulatory Visit: Payer: Self-pay | Admitting: *Deleted

## 2020-06-24 MED ORDER — GABAPENTIN 800 MG PO TABS: 800.0000 mg | ORAL_TABLET | Freq: Four times a day (QID) | ORAL | 0 refills | Status: DC

## 2020-07-15 ENCOUNTER — Other Ambulatory Visit: Payer: Self-pay | Admitting: Neurology

## 2020-07-15 ENCOUNTER — Other Ambulatory Visit: Payer: Self-pay | Admitting: *Deleted

## 2020-07-15 MED ORDER — ARMODAFINIL 200 MG PO TABS: 1.0000 | ORAL_TABLET | Freq: Every day | ORAL | 5 refills | Status: AC

## 2020-08-02 ENCOUNTER — Other Ambulatory Visit: Payer: Self-pay | Admitting: Neurology

## 2020-08-12 ENCOUNTER — Other Ambulatory Visit: Payer: Self-pay | Admitting: Neurology

## 2020-09-15 ENCOUNTER — Ambulatory Visit: Payer: 59 | Admitting: Neurology

## 2020-09-15 ENCOUNTER — Encounter: Payer: Self-pay | Admitting: Neurology

## 2020-09-15 ENCOUNTER — Ambulatory Visit (INDEPENDENT_AMBULATORY_CARE_PROVIDER_SITE_OTHER): Payer: 59 | Admitting: Neurology

## 2020-09-15 ENCOUNTER — Other Ambulatory Visit: Payer: Self-pay

## 2020-09-15 VITALS — BP 110/70 | HR 72 | Ht 64.0 in | Wt 282.5 lb

## 2020-09-15 DIAGNOSIS — Z79899 Other long term (current) drug therapy: Secondary | ICD-10-CM | POA: Diagnosis not present

## 2020-09-15 DIAGNOSIS — R5383 Other fatigue: Secondary | ICD-10-CM | POA: Diagnosis not present

## 2020-09-15 DIAGNOSIS — G35 Multiple sclerosis: Secondary | ICD-10-CM

## 2020-09-15 DIAGNOSIS — E559 Vitamin D deficiency, unspecified: Secondary | ICD-10-CM

## 2020-09-15 DIAGNOSIS — R200 Anesthesia of skin: Secondary | ICD-10-CM | POA: Diagnosis not present

## 2020-09-15 MED ORDER — METHYLPHENIDATE HCL 10 MG PO TABS: 40.0000 mg | ORAL_TABLET | Freq: Every day | ORAL | 0 refills | Status: AC

## 2020-09-15 NOTE — Progress Notes (Signed)
[facility]  PATIENT: Connie Vaughan DOB: [birth year]  REFERRING DOCTOR OR PCP:  Tobie Lords, PA SOURCE: Patient, notes from Dr. Daphane Shepherd, imaging and lab reports, MRI of the brain from 2019 personally reviewed.  _________________________________   HISTORICAL  CHIEF COMPLAINT:  Chief Complaint  Patient presents with  . Follow-up    RM 12, alone. Last seen 03/16/20. Having a lot more fatigue, this summer has been the worst. She is more concerned about this. Wanting to know if she is having a relapse or changes. Last MRI 10/25/19.   . Multiple Sclerosis    On [place].     HISTORY OF PRESENT ILLNESS:  She is a 50 y.o. woman with relapsing remitting multiple sclerosis diagnosed in 2000  Update 09/15/2020: She feels her MS is mostly stable with no exacerbation.   However, she notes more fatigue than previous summers.     Ritalin helps some.   Nuvigil helps slightly.    She did her second year of cladribine in December and January.   Lymphocytes were 0.4 3/23/221.   Other labs including CMP were fine.    She has mild left foot drop, especially if tired or hot.  She can walk 1/2 mile without rest. .  She has burning dysesthesias in the left leg and some numbness.    Gabapentin 800 mg p.o. 3 times daily helps some.  Bladder function is fine.  Vision in the left eye is better.  Emgality has helped the migraine frequency and intensity. It has helped better than any other medications.   She has not needed to take a triptan in a few months but still has milder headaches.   She had an ESI for left lumbar radiculopathy  She has sleep maintenance insomnia but gets 8 hours most nights.   Shesnores and a  PSG in the past showed no OSA.   Mood is doing about the same.  She denies cognitive dysfunction but is sometimes distractible with reduced focus/attention.   She has mild verbal fluency issues.    She has not yet done the Covid-19 vaccination.       MS history: She  presented with left optic neuritis in 1999 or 2000.   After a few months, symptoms resolved.  At the time, MRI was reportedly normal.  Two years later, she had diplopia and MRI showed a few lesions not present in 2000.   She received steroids and started Betaseron   She stopped a while while pregnant in 2006.   She had an exacerbation with left foot drop and fatigue in 2007 and also had trouble tolerating Betaseron.  She was switched to Copaxone but had site reactions and subcutaneous nodules.  Initially, she was seeing Dr. Tinnie Gens at [facility].  I saw her for a couple years in 2014 through 2015.  I switched her to Tecfidera.    She switched to see Dr. Judy Pimple after I left [place] to go to [place]..  She switched to [facility] December 2019 and completed the second year of [facility] January 2021.  Imaging studies: MRI of the brain 08/07/2018.   It shows a few periventricular lesions and one in the anterior right medulla.  MRI cervical spine 11/02/2016, by report, showed a focus at C6 compatible with MS.     She reports a lumbar spine MRI showing degnerative changes and mild spinal stenosis at one level.       MRI of the brain, lumbar spine and thoracic spine  10/26/2019: The MRI of the brain showed foci in the hemispheres and medulla, unchanged compared to the previous MRI.  There were no enhancing lesions.  MRI of the thoracic spine was normal.  MRI of the lumbar spine showed a normal conus medullaris and cauda equina.  There was a disc protrusion at L4-L5 that could irritate the L5 nerve root.  There was severe bilateral facet hypertrophy at L4-L5 and L5-S1.    REVIEW OF SYSTEMS: Constitutional: No fevers, chills, sweats, or change in appetite.  She has fatigue.  She gets frequent headaches Eyes: No visual changes, double vision, eye pain Ear, nose and throat: No hearing loss, ear pain, nasal congestion, sore throat Cardiovascular: No chest pain, palpitations Respiratory: No shortness of breath at  rest or with exertion.   No wheezes GastrointestinaI: No nausea, vomiting, diarrhea, abdominal pain, fecal incontinence Genitourinary: No dysuria, urinary retention or frequency.  No nocturia. Musculoskeletal:Lower back pain as above Integumentary: No rash, pruritus, skin lesions Neurological: as above Psychiatric: No depression at this time.  No anxiety Endocrine: No palpitations, diaphoresis, change in appetite, change in weigh or increased thirst Hematologic/Lymphatic: No anemia, purpura, petechiae. Allergic/Immunologic: No itchy/runny eyes, nasal congestion, recent allergic reactions, rashes  ALLERGIES: Allergies  Allergen Reactions  . Sulfonamide Derivatives Hives and Rash    HOME MEDICATIONS:  Current Outpatient Medications:  .  Armodafinil 200 MG TABS, Take 1 tablet by mouth daily., Disp: 30 tablet, Rfl: 5 .  aspirin-acetaminophen-caffeine (EXCEDRIN MIGRAINE) 250-250-65 MG tablet, Take by mouth every 6 (six) hours as needed for headache., Disp: , Rfl:  .  Cladribine, 10 Tabs, (MAVENCLAD, 10 TABS,) 10 MG TBPK, Take by mouth., Disp: , Rfl:  .  DULoxetine (CYMBALTA) 30 MG capsule, TAKE ONE CAPSULE BY MOUTH NIGHTLY FOR NEURALGIA PAIN, Disp: 30 capsule, Rfl: 10 .  eletriptan (RELPAX) 40 MG tablet, Take 40 mg by mouth as needed for migraine or headache. May repeat in 2 hours if headache persists or recurs., Disp: , Rfl:  .  gabapentin (NEURONTIN) 800 MG tablet, TAKE ONE TABLET BY MOUTH FOUR TIMES A DAY, Disp: 120 tablet, Rfl: 5 .  Galcanezumab-gnlm (EMGALITY) 120 MG/ML SOAJ, Inject 1 pen into the skin every 28 (twenty-eight) days., Disp: 3 pen, Rfl: 4 .  ibuprofen (ADVIL) 800 MG tablet, Take 800 mg by mouth 3 (three) times daily. 1-3 tablets TID po, Disp: , Rfl:  .  lamoTRIgine (LAMICTAL) 100 MG tablet, TAKE ONE TABLET BY MOUTH TWICE A DAY, Disp: 60 tablet, Rfl: 5 .  methylphenidate (RITALIN) 10 MG tablet, Take 4 tablets (40 mg total) by mouth daily., Disp: 120 tablet, Rfl: 0 .   metoprolol succinate (TOPROL-XL) 25 MG 24 hr tablet, Take 25 mg by mouth daily., Disp: , Rfl:  .  tiZANidine (ZANAFLEX) 4 MG tablet, Take 4 mg by mouth every 8 (eight) hours as needed for muscle spasms., Disp: , Rfl:  .  traMADol (ULTRAM) 50 MG tablet, TAKE ONE TO TWO TABLETS BY MOUTH ONE TIME DAILY AS NEEDED FOR HEADACHE OR LEG PAIN, Disp: 30 tablet, Rfl: 2 .  Vitamin D, Ergocalciferol, (DRISDOL) 1.25 MG (50000 UT) CAPS capsule, Take 50,000 units weeklyx26 weeks and then do 5000 units OTC daily thereafter, Disp: 13 capsule, Rfl: 1  PAST MEDICAL HISTORY: Past Medical History:  Diagnosis Date  . Foot drop left  . Multiple sclerosis (HCC)   . Optic neuritis     PAST SURGICAL HISTORY: Past Surgical History:  Procedure Laterality Date  . ABDOMINAL HYSTERECTOMY    .  CESAREAN SECTION    . SHOULDER SURGERY Right   . WRIST SURGERY Left     FAMILY HISTORY: No family history on file.  SOCIAL HISTORY:  Social History   Socioeconomic History  . Marital status: Married    Spouse name: Not on file  . Number of children: 1  . Years of education: 82  . Highest education level: Not on file  Occupational History  . Not on file  Tobacco Use  . Smoking status: Never Smoker  . Smokeless tobacco: Never Used  Substance and Sexual Activity  . Alcohol use: Yes    Comment: Once a week or less  . Drug use: Never  . Sexual activity: Not on file  Other Topics Concern  . Not on file  Social History Narrative   Right handed    Caffeine use: daily (2 Mt Dews per day)   Lives with husband   Social Determinants of Health   Financial Resource Strain:   . Difficulty of Paying Living Expenses: Not on file  Food Insecurity:   . Worried About Programme researcher, broadcasting/film/video in the Last Year: Not on file  . Ran Out of Food in the Last Year: Not on file  Transportation Needs:   . Lack of Transportation (Medical): Not on file  . Lack of Transportation (Non-Medical): Not on file  Physical Activity:   . Days  of Exercise per Week: Not on file  . Minutes of Exercise per Session: Not on file  Stress:   . Feeling of Stress : Not on file  Social Connections:   . Frequency of Communication with Friends and Family: Not on file  . Frequency of Social Gatherings with Friends and Family: Not on file  . Attends Religious Services: Not on file  . Active Member of Clubs or Organizations: Not on file  . Attends Banker Meetings: Not on file  . Marital Status: Not on file  Intimate Partner Violence:   . Fear of Current or Ex-Partner: Not on file  . Emotionally Abused: Not on file  . Physically Abused: Not on file  . Sexually Abused: Not on file     PHYSICAL EXAM  Vitals:   09/15/20 1404  BP: 110/70  Pulse: 72  SpO2: 97%  Weight: 282 lb 8 oz (128.1 kg)  Height:  (1.626 m)    Body mass index is 48.49 kg/m.   General: The patient is well-developed and well-nourished and in no acute distress  HEENT:  Head is [place]/AT.  Sclera are anicteric.  Funduscopic exam shows mild optic nerve pallor on the left.  Normal retinal vessels.  Neck: No carotid bruits are noted.  The neck is nontender.  Cardiovascular: The heart has a regular rate and rhythm with a normal S1 and S2. There were no murmurs, gallops or rubs.    Skin: Extremities are without rash or  edema.  Musculoskeletal:  Back is nontender  Neurologic Exam  Mental status: The patient is alert and oriented x 3 at the time of the examination. The patient has apparent normal recent and remote memory, with an apparently normal attention span and concentration ability.   Speech is normal.  Cranial nerves: Extraocular movements are full.  She has a mild APD on the left.  Color vision is reduced on the left..  Facial symmetry is present. There is good facial sensation to soft touch bilaterally.Facial strength is normal.  Trapezius and sternocleidomastoid strength is normal. No dysarthria is noted.  Hearing appears normal.  Motor:   Muscle bulk is normal.   Tone is normal. Strength is  5 / 5 in all 4 extremities except 4/5 in the left EHL of the foot.  Rapid altering movements were performed slower on the left than the right..   Sensory: Sensory testing is intact to pinprick, soft touch and vibration sensation in all 4 extremities.  Coordination: Cerebellar testing reveals good finger-nose-finger and mildly reduced left heel-to-shin .  Gait and station: Station is normal.   Her gait is mildly wide.  Minimal left foot drop.  Tandem gait is wide.  Romberg is negative.   Reflexes: Deep tendon reflexes are symmetric and normal bilaterally.         ASSESSMENT AND PLAN  MULTIPLE SCLEROSIS - Plan: CBC with Differential/Platelet, Thyroid Panel With TSH, Comprehensive metabolic panel, MR BRAIN W WO CONTRAST, Vitamin B12  High risk medication use - Plan: CBC with Differential/Platelet, Thyroid Panel With TSH, Comprehensive metabolic panel, MR BRAIN W WO CONTRAST, Vitamin B12  Other fatigue - Plan: Vitamin B12  Numbness in left leg - Plan: Vitamin B12  Vitamin D deficiency    1.   She had her second year of [facility] December January.  We will check blood work for CBC with differential, CMP.  Check MRI to determine if there is any subclinical progression.  If this is occurring we would need to consider adding a disease modifying therapy. 2.    Due to her fatigue we will also check a thyroid panel and B12 level 3.    Continue Ritalin and Nuvigil for fatigue and sleepiness. 4.    Continue gabapentin for dysesthesias. 5.   Return in 6 months or sooner if there are new or worsening neurologic symptoms. Candace Begue A. Epimenio Foot, MD, [facility] 09/15/2020, 2:46 PM Certified in Neurology, Clinical Neurophysiology, Sleep Medicine and Neuroimaging  [facility] [address] [place], [place] [address] [phone]

## 2020-09-16 ENCOUNTER — Telehealth: Payer: Self-pay | Admitting: Neurology

## 2020-09-16 ENCOUNTER — Telehealth: Payer: Self-pay | Admitting: *Deleted

## 2020-09-16 LAB — CBC WITH DIFFERENTIAL/PLATELET
Basophils Absolute: 0.1 10*3/uL (ref 0.0–0.2)
Basos: 1 %
EOS (ABSOLUTE): 0.4 10*3/uL (ref 0.0–0.4)
Eos: 4 %
Hematocrit: 41.1 % (ref 34.0–46.6)
Hemoglobin: 13.9 g/dL (ref 11.1–15.9)
Immature Grans (Abs): 0.1 10*3/uL (ref 0.0–0.1)
Immature Granulocytes: 1 %
Lymphocytes Absolute: 0.8 10*3/uL (ref 0.7–3.1)
Lymphs: 8 %
MCH: 30.5 pg (ref 26.6–33.0)
MCHC: 33.8 g/dL (ref 31.5–35.7)
MCV: 90 fL (ref 79–97)
Monocytes Absolute: 0.9 10*3/uL (ref 0.1–0.9)
Monocytes: 9 %
Neutrophils Absolute: 8.4 10*3/uL — ABNORMAL HIGH (ref 1.4–7.0)
Neutrophils: 77 %
Platelets: 353 10*3/uL (ref 150–450)
RBC: 4.55 x10E6/uL (ref 3.77–5.28)
RDW: 12.7 % (ref 11.7–15.4)
WBC: 10.6 10*3/uL (ref 3.4–10.8)

## 2020-09-16 LAB — VITAMIN B12: Vitamin B-12: 477 pg/mL (ref 232–1245)

## 2020-09-16 LAB — THYROID PANEL WITH TSH
Free Thyroxine Index: 1.8 (ref 1.2–4.9)
T3 Uptake Ratio: 22 % — ABNORMAL LOW (ref 24–39)
T4, Total: 8.1 ug/dL (ref 4.5–12.0)
TSH: 0.819 u[IU]/mL (ref 0.450–4.500)

## 2020-09-16 LAB — COMPREHENSIVE METABOLIC PANEL
ALT: 15 IU/L (ref 0–32)
AST: 12 IU/L (ref 0–40)
Albumin/Globulin Ratio: 1.5 (ref 1.2–2.2)
Albumin: 4.4 g/dL (ref 3.8–4.8)
Alkaline Phosphatase: 125 IU/L — ABNORMAL HIGH (ref 44–121)
BUN/Creatinine Ratio: 25 — ABNORMAL HIGH (ref 9–23)
BUN: 27 mg/dL — ABNORMAL HIGH (ref 6–24)
Bilirubin Total: <0.2 mg/dL (ref 0.0–1.2)
CO2: 26 mmol/L (ref 20–29)
Calcium: 9.1 mg/dL (ref 8.7–10.2)
Chloride: 99 mmol/L (ref 96–106)
Creatinine, Ser: 1.1 mg/dL — ABNORMAL HIGH (ref 0.57–1.00)
GFR calc Af Amer: 70 mL/min/{1.73_m2} (ref >59)
GFR calc non Af Amer: 60 mL/min/{1.73_m2} (ref >59)
Globulin, Total: 2.9 g/dL (ref 1.5–4.5)
Glucose: 89 mg/dL (ref 65–99)
Potassium: 4.8 mmol/L (ref 3.5–5.2)
Sodium: 141 mmol/L (ref 134–144)
Total Protein: 7.3 g/dL (ref 6.0–8.5)

## 2020-09-16 NOTE — Telephone Encounter (Signed)
-----   Message from Huston Foley, MD sent at 09/16/2020  5:13 PM EDT ----- Please call patient and advise her that her labs are fairly benign, one liver enzyme called alkaline phosphatase was mildly elevated, just slightly above the normal cutoff and this can be monitored over time.  Her BUN and creatinine were slightly elevated, these are markers for kidney function.  I would encourage her to stay really well-hydrated, increase water intake.

## 2020-09-16 NOTE — Telephone Encounter (Signed)
[employer] Auth: [id number] (exp. 09/16/20 to 10/31/20). I sent a message to [place] with [facility] to reach out to the patient to schedule her MRI because that is where she had her last MRI's at.

## 2020-09-16 NOTE — Progress Notes (Signed)
Please call patient and advise her that her labs are fairly benign, one liver enzyme called alkaline phosphatase was mildly elevated, just slightly above the normal cutoff and this can be monitored over time.  Her BUN and creatinine were slightly elevated, these are markers for kidney function.  I would encourage her to stay really well-hydrated, increase water intake.

## 2020-09-20 NOTE — Telephone Encounter (Signed)
Patient is scheduled at [facility] for 09/25/20

## 2020-09-24 ENCOUNTER — Other Ambulatory Visit: Payer: Self-pay | Admitting: Neurology

## 2020-09-25 ENCOUNTER — Encounter (HOSPITAL_BASED_OUTPATIENT_CLINIC_OR_DEPARTMENT_OTHER): Payer: Self-pay

## 2020-09-25 ENCOUNTER — Ambulatory Visit (HOSPITAL_BASED_OUTPATIENT_CLINIC_OR_DEPARTMENT_OTHER): Payer: 59

## 2020-10-02 ENCOUNTER — Ambulatory Visit (HOSPITAL_BASED_OUTPATIENT_CLINIC_OR_DEPARTMENT_OTHER)
Admission: RE | Admit: 2020-10-02 | Discharge: 2020-10-02 | Disposition: A | Discharge status: Home / Self Care | Payer: 59 | Source: Ambulatory Visit | Attending: Neurology | Admitting: Neurology

## 2020-10-02 ENCOUNTER — Other Ambulatory Visit: Payer: Self-pay

## 2020-10-02 DIAGNOSIS — G35 Multiple sclerosis: Secondary | ICD-10-CM | POA: Diagnosis not present

## 2020-10-02 DIAGNOSIS — Z79899 Other long term (current) drug therapy: Secondary | ICD-10-CM | POA: Diagnosis present

## 2020-10-02 IMAGING — MR MR HEAD WO/W CM
9 of 11 series · 36 of 48 positions shown · IV contrast (gadavist)
Comparison: [DATE]

CLINICAL DATA: Follow-up multiple sclerosis

EXAM:
MRI HEAD WITHOUT AND WITH CONTRAST
TECHNIQUE: Multiplanar, multiecho pulse sequences of the brain and surrounding
structures were obtained without and with intravenous contrast.
CONTRAST:  10mL GADAVIST GADOBUTROL 1 MMOL/ML IV SOLN

[Series 2: DWI · axial · 3.0mm · 2.19mm/px · z∈[-69,+86]mm · 11 of 96 slices shown (1 of 2)]
[im 1/96]
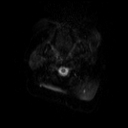
[im 10/96]
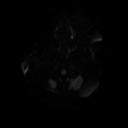
[im 20/96]
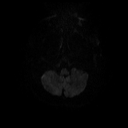
[im 29/96]
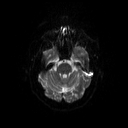
[im 39/96]
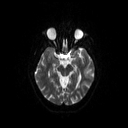
[im 48/96]
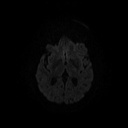
[im 58/96]
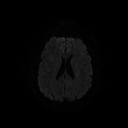
[im 67/96]
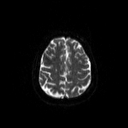
[im 77/96]
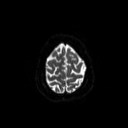
[im 86/96]
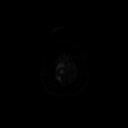
[im 96/96]
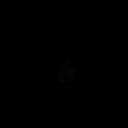

[Series 3: DWI · axial · 3.0mm · 2.19mm/px · z∈[-69,+86]mm · 6 of 48 slices shown (2 of 2)]
[im 1/48]
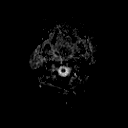
[im 10/48]
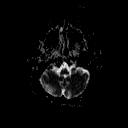
[im 19/48]
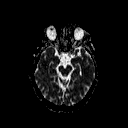
[im 29/48]
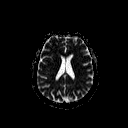
[im 38/48]
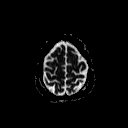
[im 48/48]
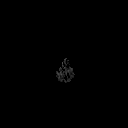

[Series 4: T1 · sagittal · 5.0mm · 0.47mm/px · 3 of 23 slices shown]
[im 1/23]
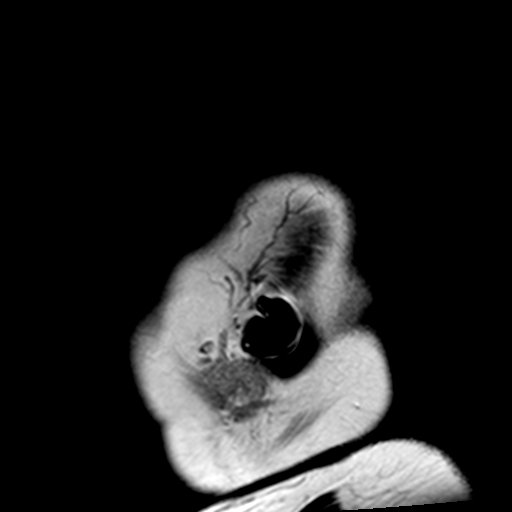
[im 12/23]
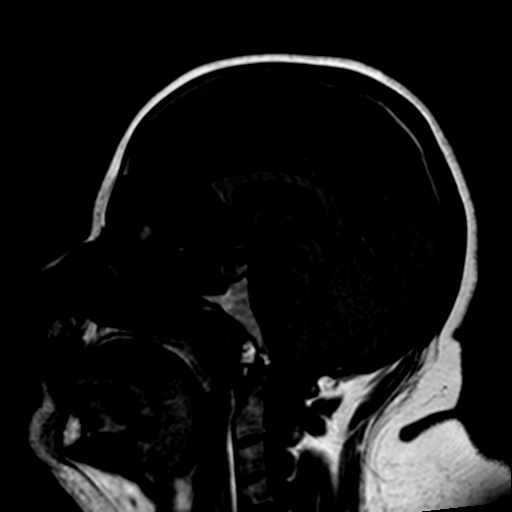
[im 23/23]
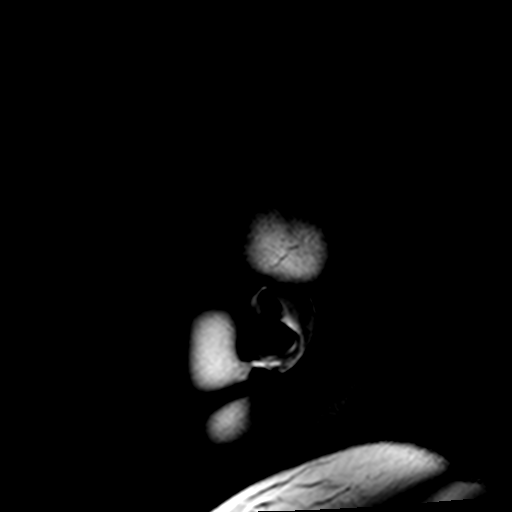

[Series 5: FLAIR · sagittal · 4.0mm · 0.47mm/px · 3 of 30 slices shown (1 of 2)]
[im 1/30]
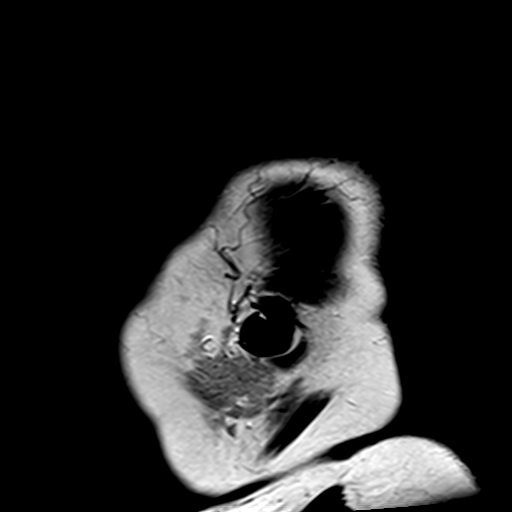
[im 15/30]
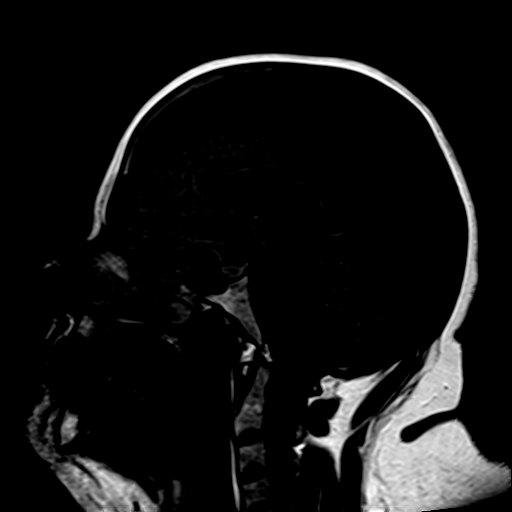
[im 30/30]
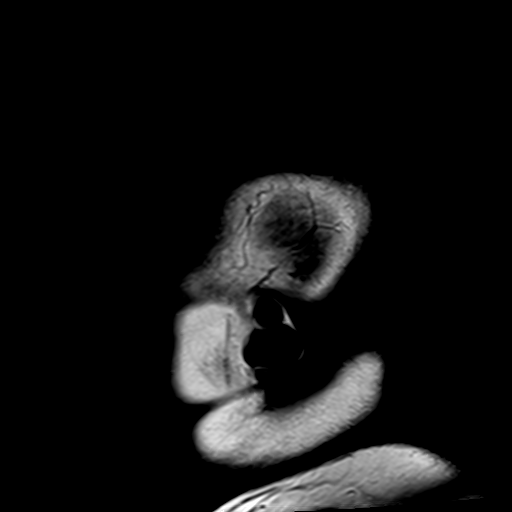

[Series 6: FLAIR · axial · 3.0mm · 0.45mm/px · z∈[-75,+86]mm · 3 of 28 slices shown (2 of 2)]
[im 1/28]
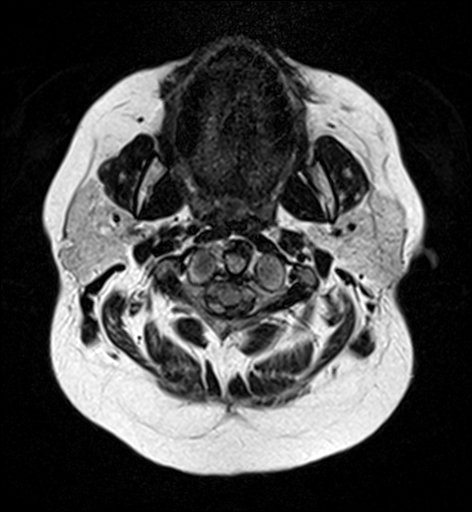
[im 14/28]
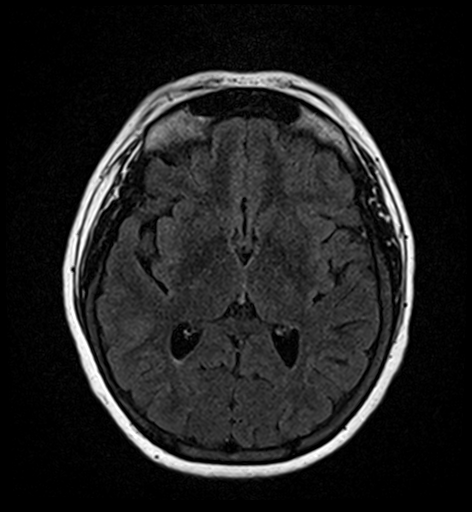
[im 28/28]
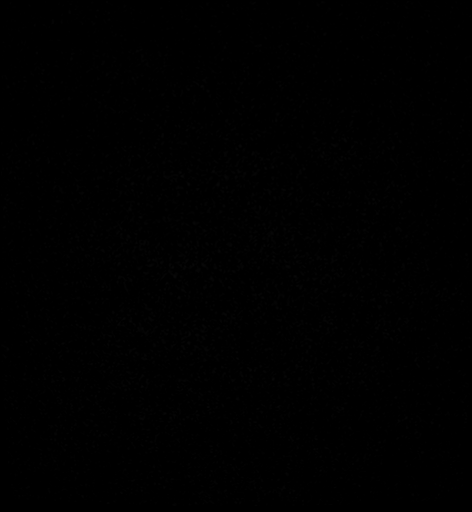

[Series 7: T2 · axial · 5.0mm · 0.45mm/px · z∈[-71,+82]mm · 2 of 23 slices shown (1 of 2)]
[im 1/23]
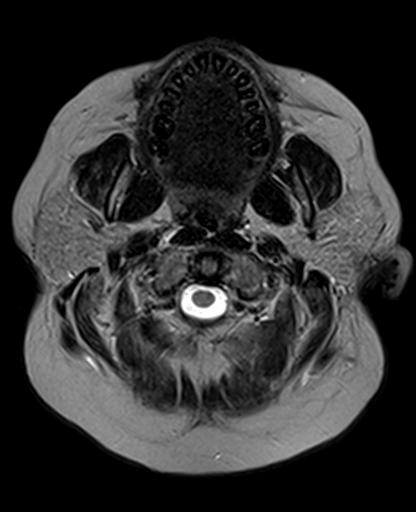
[im 23/23]
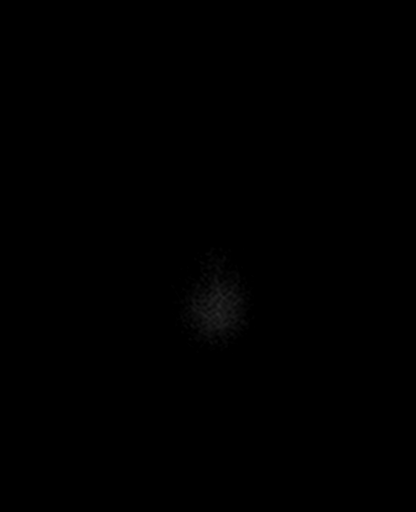

[Series 8: T2 · axial · 5.0mm · 0.45mm/px · z∈[-71,+82]mm · 2 of 23 slices shown (2 of 2)]
[im 1/23]
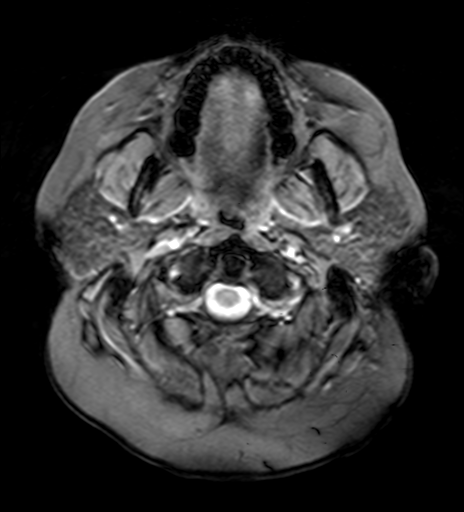
[im 23/23]
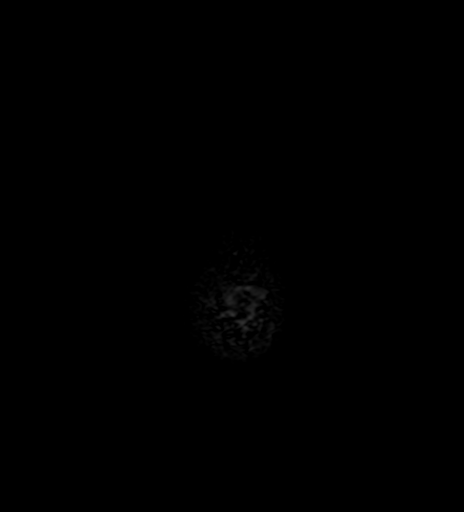

[Series 10: T2 post-contrast · coronal · 5.0mm · 0.47mm/px · 3 of 28 slices shown]
[im 1/28]
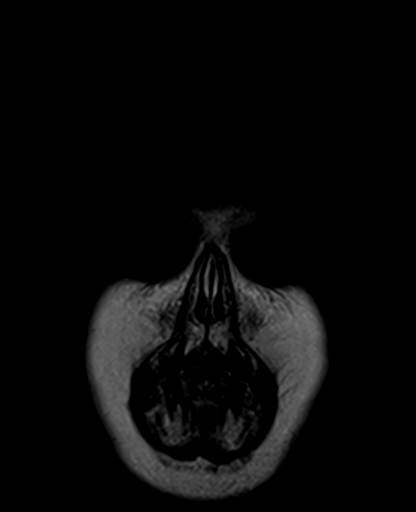
[im 14/28]
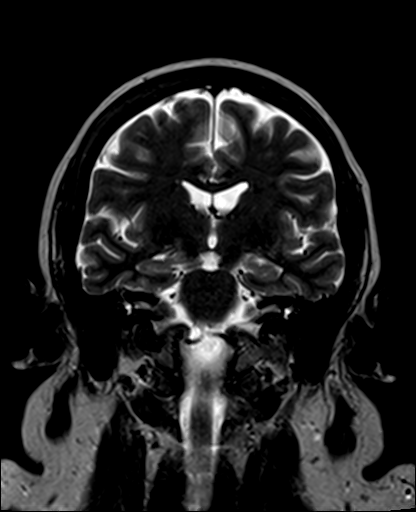
[im 28/28]
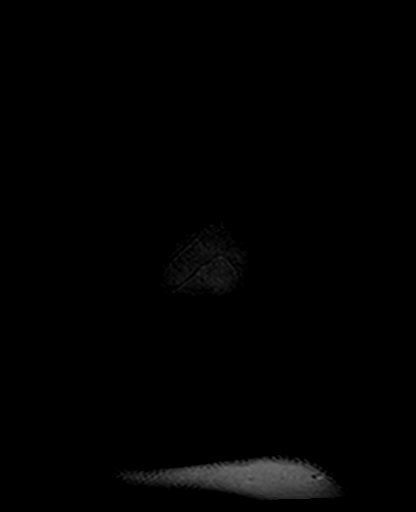

[Series 12: T1 post-contrast · coronal · 5.0mm · 0.47mm/px · 3 of 28 slices shown]
[im 1/28]
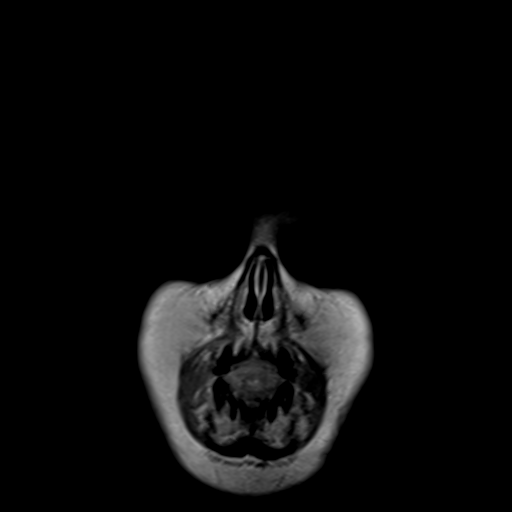
[im 14/28]
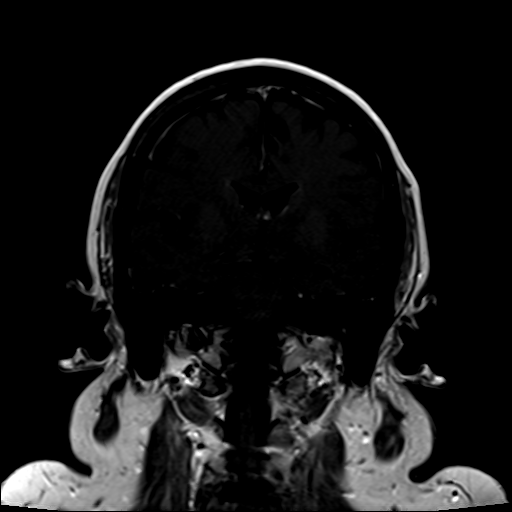
[im 28/28]
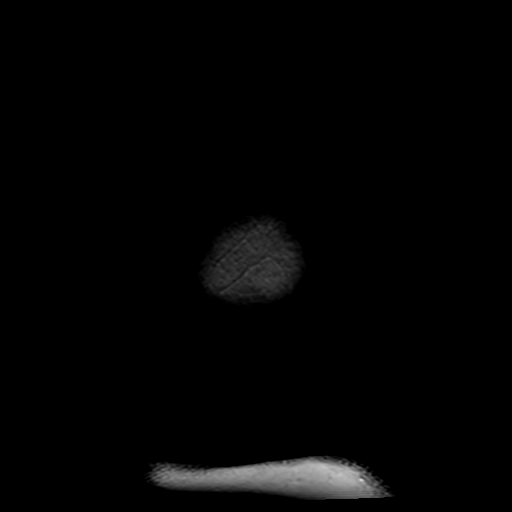

[36 of 48 positions shown; findings below may reference images not displayed]

FINDINGS: Brain: The brainstem and cerebellum are normal. Cerebral hemispheres
show 5 or 6 foci of T2 and FLAIR signal within the white matter
consistent with the clinical diagnosis of multiple sclerosis,
unchanged since last year. No new or progressive lesions. No lesions
show restricted diffusion or contrast enhancement. No sign of
ischemic insult. No mass, hemorrhage, hydrocephalus or extra-axial
collection.

Vascular: Major vessels at the base of the brain show flow.

Skull and upper cervical spine: Negative

Sinuses/Orbits: Clear/normal

Other: None
IMPRESSION: Approximately 5 or 6 foci of T2 and FLAIR signal within the cerebral
hemispheric white matter consistent with the clinical diagnosis of
multiple sclerosis. No new or progressive lesions. No lesions show
restricted diffusion or contrast enhancement.

## 2020-10-02 MED ORDER — GADOBUTROL 1 MMOL/ML IV SOLN
10.0000 mL | Freq: Once | INTRAVENOUS | Status: AC | PRN
  Administered 2020-10-02: 10 mL via INTRAVENOUS

## 2020-10-08 ENCOUNTER — Other Ambulatory Visit: Payer: Self-pay | Admitting: Neurology

## 2020-10-08 MED ORDER — TEMAZEPAM 15 MG PO CAPS: 15.0000 mg | ORAL_CAPSULE | Freq: Every evening | ORAL | 2 refills | Status: AC | PRN

## 2020-10-18 ENCOUNTER — Telehealth: Payer: Self-pay | Admitting: *Deleted

## 2020-10-18 NOTE — Telephone Encounter (Signed)
Left voice mail not able to reach pt. 

## 2020-11-09 ENCOUNTER — Other Ambulatory Visit: Payer: Self-pay | Admitting: Neurology

## 2021-01-24 ENCOUNTER — Other Ambulatory Visit: Payer: Self-pay | Admitting: Neurology

## 2021-02-07 ENCOUNTER — Other Ambulatory Visit: Payer: Self-pay | Admitting: Neurology

## 2021-03-09 ENCOUNTER — Other Ambulatory Visit: Payer: Self-pay | Admitting: Neurology

## 2021-03-16 ENCOUNTER — Ambulatory Visit: Payer: 59 | Admitting: Neurology

## 2021-04-08 ENCOUNTER — Other Ambulatory Visit: Payer: Self-pay | Admitting: Neurology

## 2021-05-08 ENCOUNTER — Other Ambulatory Visit: Payer: Self-pay | Admitting: Neurology

## 2021-07-08 ENCOUNTER — Other Ambulatory Visit: Payer: Self-pay | Admitting: Neurology

## 2021-07-25 ENCOUNTER — Other Ambulatory Visit: Payer: Self-pay | Admitting: Neurology

## 2021-08-07 ENCOUNTER — Other Ambulatory Visit: Payer: Self-pay | Admitting: Neurology

## 2021-08-10 ENCOUNTER — Other Ambulatory Visit: Payer: Self-pay | Admitting: Neurology

## 2021-09-06 ENCOUNTER — Other Ambulatory Visit: Payer: Self-pay | Admitting: Neurology

## 2021-09-06 ENCOUNTER — Encounter: Payer: Self-pay | Admitting: Emergency Medicine

## 2021-10-06 ENCOUNTER — Other Ambulatory Visit: Payer: Self-pay | Admitting: Neurology

## 2021-10-07 ENCOUNTER — Other Ambulatory Visit: Payer: Self-pay | Admitting: Neurology
